# Patient Record
Sex: Female | Born: 1960
Health system: Southern US, Community
[De-identification: ages and names within clinical notes are randomized; demographics above are authoritative.]

## PROBLEM LIST (undated history)

## (undated) DIAGNOSIS — Q211 Atrial septal defect, unspecified: Secondary | ICD-10-CM

## (undated) DIAGNOSIS — I071 Rheumatic tricuspid insufficiency: Secondary | ICD-10-CM

## (undated) DIAGNOSIS — R001 Bradycardia, unspecified: Secondary | ICD-10-CM

## (undated) DIAGNOSIS — E669 Obesity, unspecified: Secondary | ICD-10-CM

## (undated) DIAGNOSIS — I340 Nonrheumatic mitral (valve) insufficiency: Secondary | ICD-10-CM

## (undated) DIAGNOSIS — G43909 Migraine, unspecified, not intractable, without status migrainosus: Secondary | ICD-10-CM

## (undated) DIAGNOSIS — E785 Hyperlipidemia, unspecified: Secondary | ICD-10-CM

## (undated) DIAGNOSIS — E039 Hypothyroidism, unspecified: Secondary | ICD-10-CM

## (undated) DIAGNOSIS — R011 Cardiac murmur, unspecified: Secondary | ICD-10-CM

## (undated) HISTORY — DX: Hypothyroidism, unspecified: E03.9

## (undated) HISTORY — DX: Hyperlipidemia, unspecified: E78.5

## (undated) HISTORY — DX: Rheumatic tricuspid insufficiency: I07.1

## (undated) HISTORY — DX: Nonrheumatic mitral (valve) insufficiency: I34.0

## (undated) HISTORY — DX: Obesity, unspecified: E66.9

## (undated) HISTORY — DX: Atrial septal defect: Q21.1

## (undated) HISTORY — DX: Bradycardia, unspecified: R00.1

## (undated) HISTORY — DX: Atrial septal defect, unspecified: Q21.10

---

## 1998-07-21 ENCOUNTER — Ambulatory Visit (HOSPITAL_COMMUNITY): Admission: RE | Admit: 1998-07-21 | Discharge: 1998-07-21 | Payer: Self-pay | Admitting: *Deleted

## 1998-07-21 ENCOUNTER — Encounter: Admission: RE | Admit: 1998-07-21 | Discharge: 1998-07-21 | Payer: Self-pay | Admitting: Sports Medicine

## 1999-02-06 ENCOUNTER — Encounter: Admission: RE | Admit: 1999-02-06 | Discharge: 1999-02-06 | Payer: Self-pay | Admitting: Sports Medicine

## 1999-02-20 ENCOUNTER — Encounter: Payer: Self-pay | Admitting: Sports Medicine

## 1999-02-20 ENCOUNTER — Encounter: Admission: RE | Admit: 1999-02-20 | Discharge: 1999-02-20 | Payer: Self-pay | Admitting: Sports Medicine

## 2000-01-19 ENCOUNTER — Other Ambulatory Visit: Admission: RE | Admit: 2000-01-19 | Discharge: 2000-01-19 | Payer: Self-pay | Admitting: Family Medicine

## 2001-01-23 ENCOUNTER — Other Ambulatory Visit: Admission: RE | Admit: 2001-01-23 | Discharge: 2001-01-23 | Payer: Self-pay | Admitting: Family Medicine

## 2001-01-25 ENCOUNTER — Encounter: Admission: RE | Admit: 2001-01-25 | Discharge: 2001-01-25 | Payer: Self-pay | Admitting: Family Medicine

## 2001-01-25 ENCOUNTER — Encounter: Payer: Self-pay | Admitting: Family Medicine

## 2001-07-06 ENCOUNTER — Encounter: Admission: RE | Admit: 2001-07-06 | Discharge: 2001-07-06 | Payer: Self-pay | Admitting: Family Medicine

## 2002-08-22 ENCOUNTER — Ambulatory Visit (HOSPITAL_COMMUNITY): Admission: RE | Admit: 2002-08-22 | Discharge: 2002-08-22 | Payer: Self-pay | Admitting: Family Medicine

## 2002-08-22 ENCOUNTER — Encounter: Payer: Self-pay | Admitting: Family Medicine

## 2003-05-02 ENCOUNTER — Ambulatory Visit (HOSPITAL_COMMUNITY): Admission: RE | Admit: 2003-05-02 | Discharge: 2003-05-02 | Payer: Self-pay | Admitting: Family Medicine

## 2004-05-15 ENCOUNTER — Ambulatory Visit: Payer: Self-pay | Admitting: Sports Medicine

## 2004-12-23 ENCOUNTER — Other Ambulatory Visit: Admission: RE | Admit: 2004-12-23 | Discharge: 2004-12-23 | Payer: Self-pay | Admitting: Family Medicine

## 2004-12-30 ENCOUNTER — Encounter: Admission: RE | Admit: 2004-12-30 | Discharge: 2004-12-30 | Payer: Self-pay | Admitting: Sports Medicine

## 2005-01-11 ENCOUNTER — Encounter: Admission: RE | Admit: 2005-01-11 | Discharge: 2005-01-11 | Payer: Self-pay | Admitting: Sports Medicine

## 2005-12-27 ENCOUNTER — Other Ambulatory Visit: Admission: RE | Admit: 2005-12-27 | Discharge: 2005-12-27 | Payer: Self-pay | Admitting: Family Medicine

## 2006-01-27 ENCOUNTER — Ambulatory Visit (HOSPITAL_COMMUNITY): Admission: RE | Admit: 2006-01-27 | Discharge: 2006-01-27 | Payer: Self-pay | Admitting: Family Medicine

## 2006-01-31 DIAGNOSIS — I071 Rheumatic tricuspid insufficiency: Secondary | ICD-10-CM

## 2006-01-31 DIAGNOSIS — I340 Nonrheumatic mitral (valve) insufficiency: Secondary | ICD-10-CM

## 2006-01-31 HISTORY — DX: Rheumatic tricuspid insufficiency: I07.1

## 2006-01-31 HISTORY — PX: DOPPLER ECHOCARDIOGRAPHY: SHX263

## 2006-01-31 HISTORY — DX: Nonrheumatic mitral (valve) insufficiency: I34.0

## 2006-02-02 ENCOUNTER — Encounter: Payer: Self-pay | Admitting: Cardiology

## 2006-05-22 IMAGING — CT CT PARANASAL SINUSES LIMITED
1 series · 16 of 24 positions shown, 20 images · non-contrast
Comparison: none

CLINICAL DATA: Facial pressure and drainage.  
LIMITED CT OF PARANASAL SINUSES:
TECHNIQUE: Limited coronal CT images were obtained through the paranasal sinuses without intravenous contrast.

[Series 2: limited sinus · axial · 0.33mm/px · z∈[+15,+97]mm · 16 of 24 slices shown, 20 images]
[im 2/24  brain]
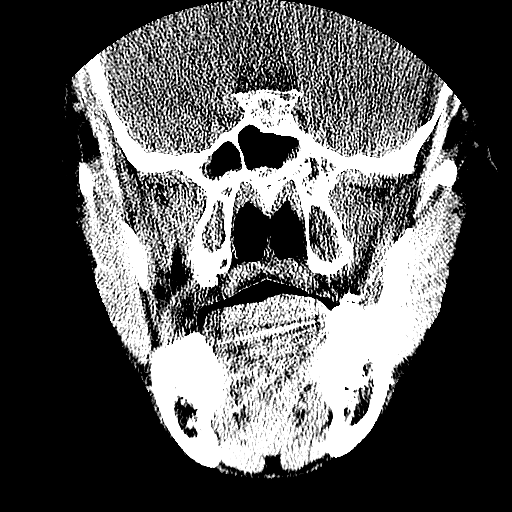
[im 2/24  bone]
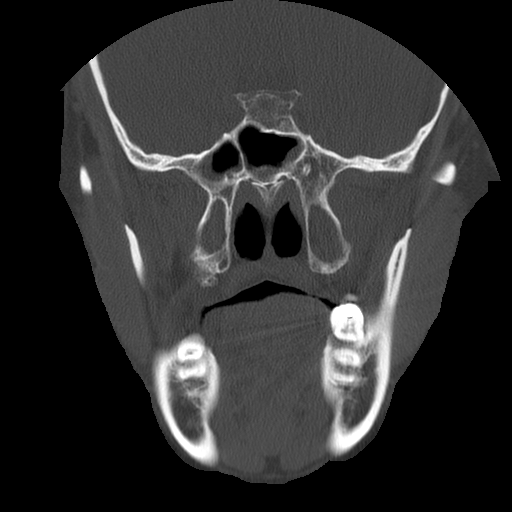
[im 4/24  bone]
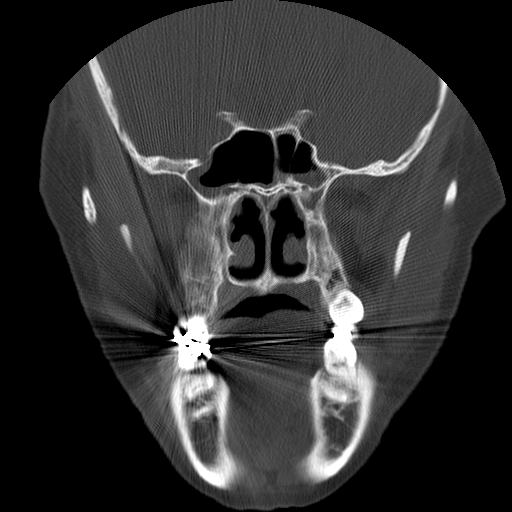
[im 5/24  bone]
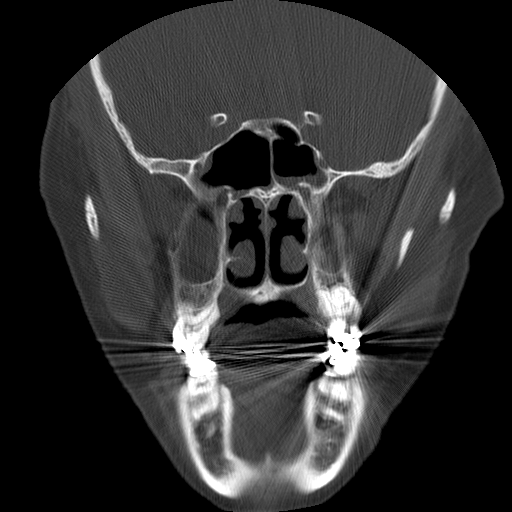
[im 6/24  bone]
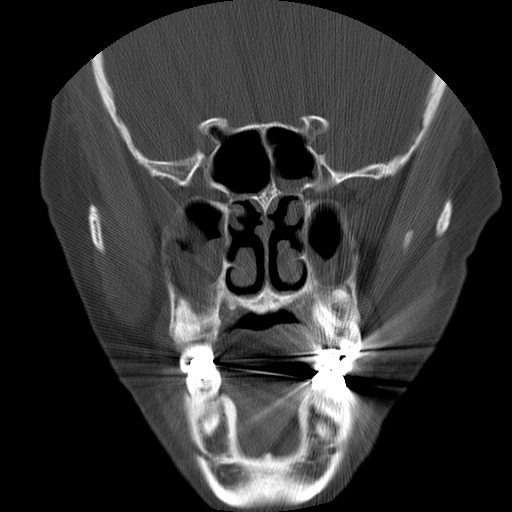
[im 8/24  brain]
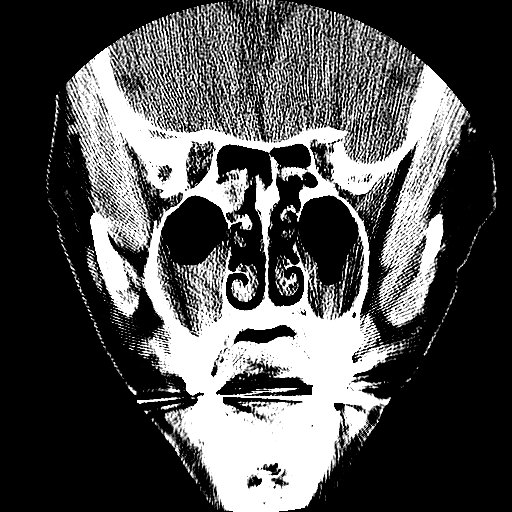
[im 8/24  bone]
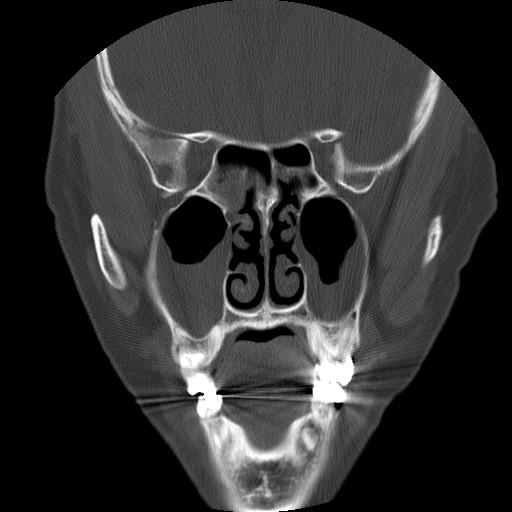
[im 9/24  bone]
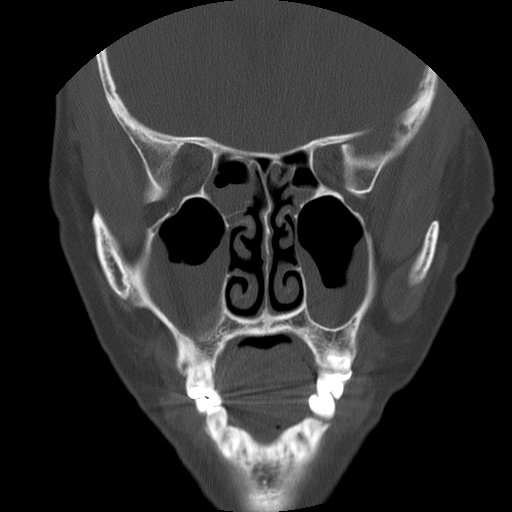
[im 10/24  bone]
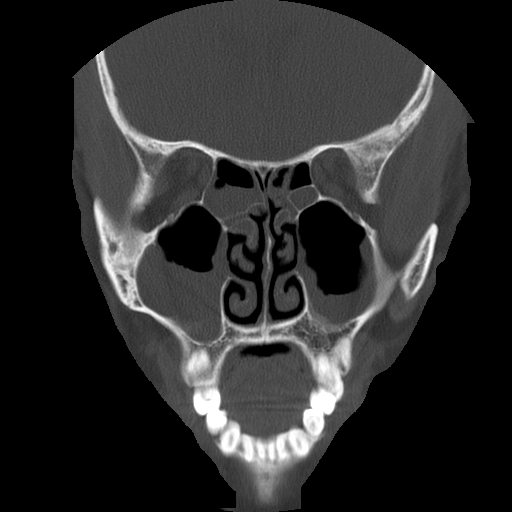
[im 12/24  bone]
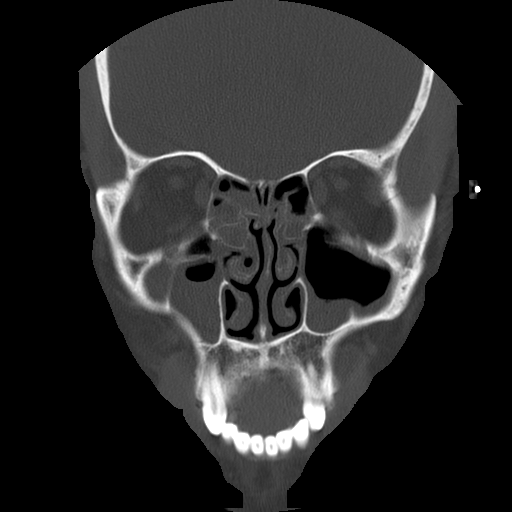
[im 13/24  brain]
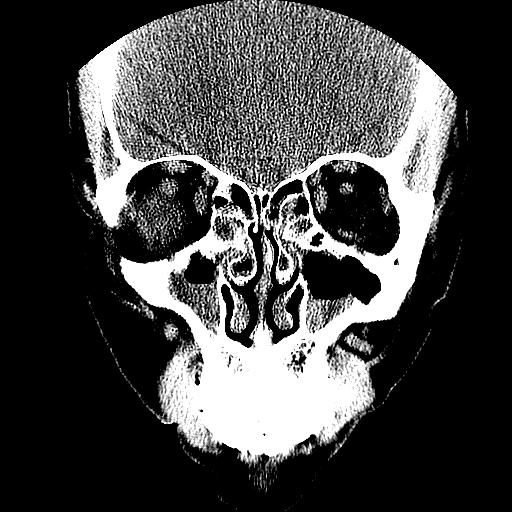
[im 13/24  bone]
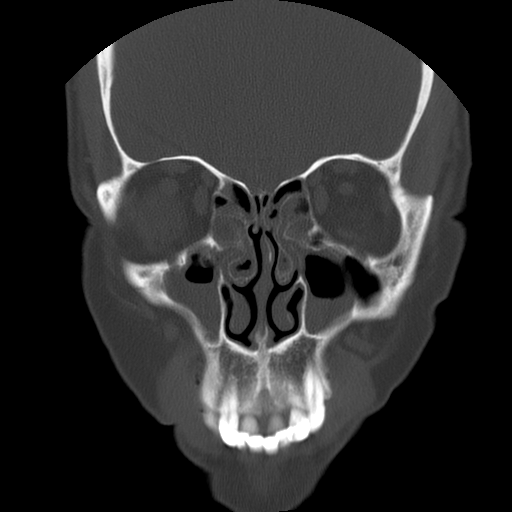
[im 15/24  bone]
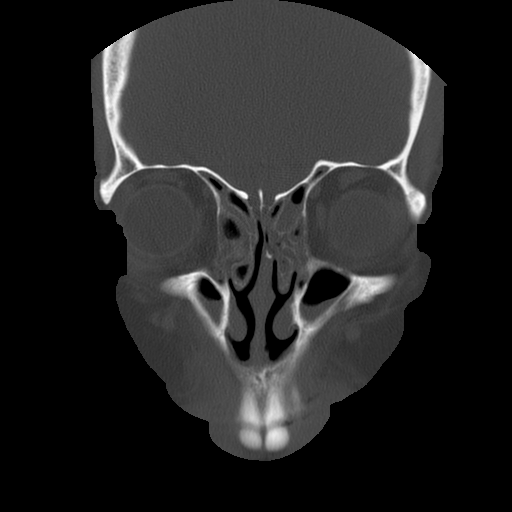
[im 16/24  bone]
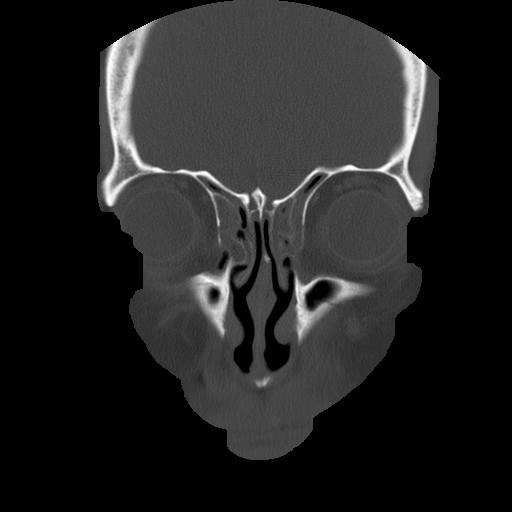
[im 17/24  bone]
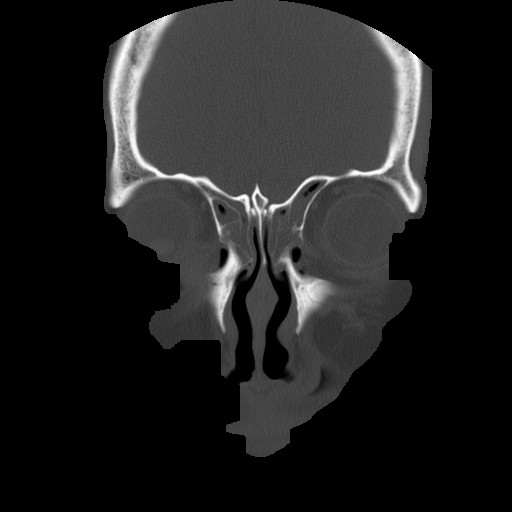
[im 19/24  brain]
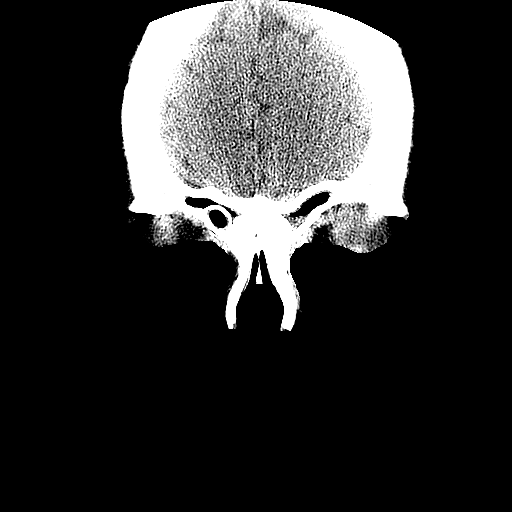
[im 19/24  bone]
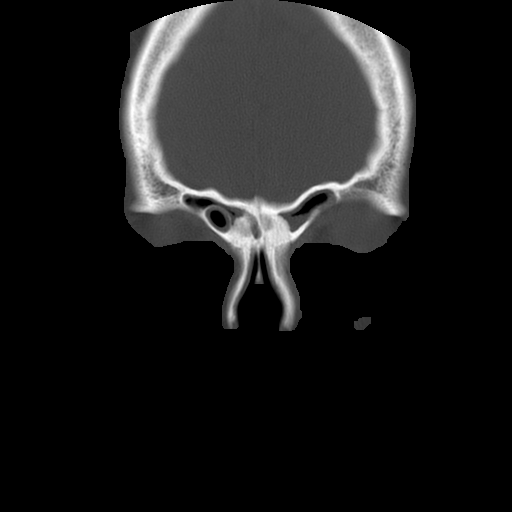
[im 20/24  bone]
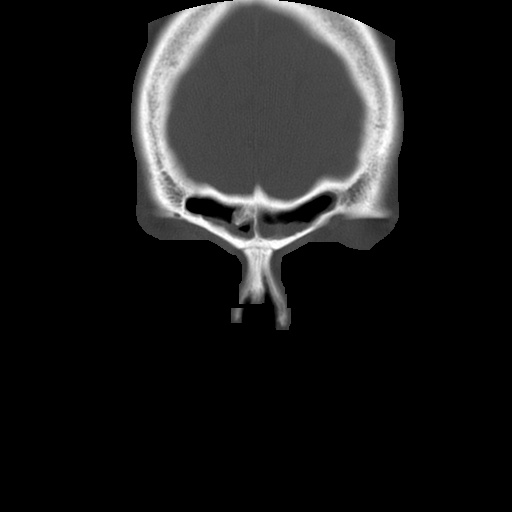
[im 21/24  bone]
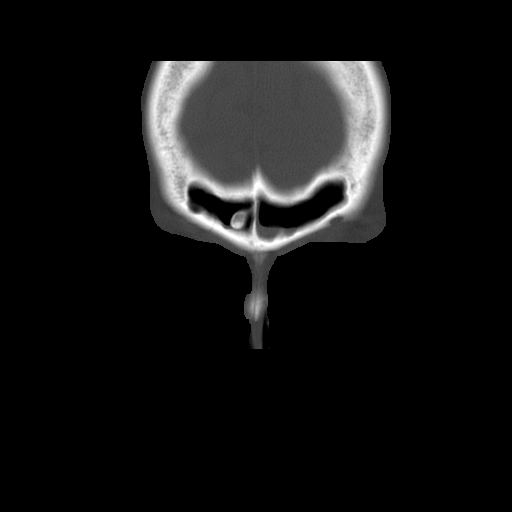
[im 23/24  bone]
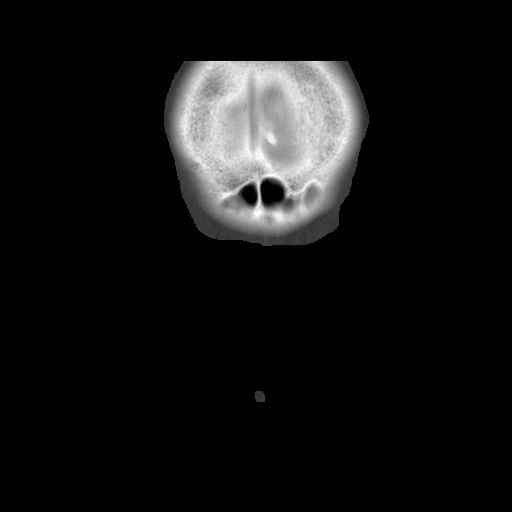

[16 of 24 positions shown; findings below may reference images not displayed]

FINDINGS: Mucosal thickening involves the frontal, ethmoid, sphenoid, and maxillary sinuses.  Some fluid may be present as well involving all of the sinuses.  Opacification of multiple ethmoid sinus air cells.  Mucosal thickening encroaches on the ostiomeatal units including the infundibula and middle meatus.  No evidence of bony destruction.  Some of the ethmoid sinus bony septae may be attenuated due to the effects of chronic disease.  Nasal turbinates are unremarkable.  Pneumatization of the left nasal turbinate.
IMPRESSION: Pansinusitis.  See comments above.

## 2008-05-06 HISTORY — PX: ELECTROCARDIOGRAM: SHX264

## 2009-03-05 ENCOUNTER — Other Ambulatory Visit: Admission: RE | Admit: 2009-03-05 | Discharge: 2009-03-05 | Payer: Self-pay | Admitting: Family Medicine

## 2009-03-21 ENCOUNTER — Encounter: Payer: Self-pay | Admitting: Family Medicine

## 2009-09-23 ENCOUNTER — Ambulatory Visit: Payer: Self-pay | Admitting: Family Medicine

## 2009-09-23 DIAGNOSIS — M719 Bursopathy, unspecified: Secondary | ICD-10-CM

## 2009-09-23 DIAGNOSIS — M67919 Unspecified disorder of synovium and tendon, unspecified shoulder: Secondary | ICD-10-CM | POA: Insufficient documentation

## 2010-05-05 NOTE — Assessment & Plan Note (Signed)
Summary: R SHOULDER PAIN,MC   Vital Signs:  Patient profile:   50 year old female Height:      68 inches Weight:      208 pounds BMI:     31.74 BP sitting:   120 / 70  Vitals Entered By: Lillia Pauls CMA (September 23, 2009 1:44 PM)  History of Present Illness: Several months of worsening right shoulder pain.  Pain with overhead motion, difficulty taking shirt off when she has to pullover her head. No specific injury.  No weakness, no numbness or tingling in her hand or arm. Does have pain that radiates all of the way down to her hand at times.  PERTINENT PMH/PSH: No previous right shoulder injury or surgery. Not diabetic PERTINENTSH nonsmoker,    Allergies (verified): No Known Drug Allergies  Review of Systems  The patient denies fever.         see hpi  Physical Exam  General:  alert and well-hydrated.   Msk:  FROm right shoulder with intact strength in all rotator cuff muscles. pain with suprapinati=us testing and some with resisted external rotaton. Bicep tendon is nontender to palpation. Bicep and eltoid intact strength, no deformity. Distally neurovascularly intact. crossover test extremely positive Additional Exam:  [place] rotator cuff muscles intact wit no sign of tear. Some edema in subacromial bursa. No impingement seen on dynamic testing. Subscapularis muscle appears unusually thin but no tear is seen. Patient given informed consent for injection. Discussed possible complications of infection, bleeding or skin atrophy at site of injection. Possible side effect of avascular necrosis (focal area of bone death) due to steroid use.Appropriate verbal time out taken Are cleaned and prepped in usual sterile fashion. A ---1- cc kennalog plus ---4-cc 1% lidocaine without epinephrine was injected into the-right subacromial bursa--. Patient tolerated procedure well with no complications.    Impression & Recommendations:  Problem # 1:  ROTATOR CUFF SYNDROME, RIGHT  (ICD-726.10)  Orders: Joint Aspirate / Injection, Large (14782) Kenalog 10 mg inj (J3301) Theraband per yard (A9300)  Complete Medication List: 1)  Flexeril 10 Mg Tabs (Cyclobenzaprine hcl) .... 1/2 -1 tab by mouth two times a day or three times a day as needed back pain

## 2010-08-19 ENCOUNTER — Other Ambulatory Visit (HOSPITAL_BASED_OUTPATIENT_CLINIC_OR_DEPARTMENT_OTHER): Payer: Self-pay | Admitting: Family Medicine

## 2010-08-19 ENCOUNTER — Ambulatory Visit (HOSPITAL_BASED_OUTPATIENT_CLINIC_OR_DEPARTMENT_OTHER)
Admission: RE | Admit: 2010-08-19 | Discharge: 2010-08-19 | Disposition: A | Discharge status: Home / Self Care | Payer: 59 | Source: Ambulatory Visit | Attending: Family Medicine | Admitting: Family Medicine

## 2010-08-19 DIAGNOSIS — Z1231 Encounter for screening mammogram for malignant neoplasm of breast: Secondary | ICD-10-CM | POA: Insufficient documentation

## 2010-08-28 ENCOUNTER — Encounter: Payer: Self-pay | Admitting: Internal Medicine

## 2010-09-01 ENCOUNTER — Other Ambulatory Visit: Payer: Self-pay | Admitting: *Deleted

## 2010-09-01 MED ORDER — KETOPROFEN POWD: Status: DC

## 2010-09-04 ENCOUNTER — Encounter: Payer: Self-pay | Admitting: Internal Medicine

## 2010-09-07 ENCOUNTER — Encounter: Payer: Self-pay | Admitting: Internal Medicine

## 2010-09-07 ENCOUNTER — Ambulatory Visit (INDEPENDENT_AMBULATORY_CARE_PROVIDER_SITE_OTHER): Payer: 59 | Admitting: Internal Medicine

## 2010-09-07 DIAGNOSIS — E785 Hyperlipidemia, unspecified: Secondary | ICD-10-CM

## 2010-09-07 DIAGNOSIS — Q211 Atrial septal defect, unspecified: Secondary | ICD-10-CM

## 2010-09-07 DIAGNOSIS — E669 Obesity, unspecified: Secondary | ICD-10-CM

## 2010-09-07 DIAGNOSIS — Q2111 Secundum atrial septal defect: Secondary | ICD-10-CM

## 2010-09-07 NOTE — Patient Instructions (Signed)
Your physician wants you to follow-up in: 18 months with Dr. Ross.  You will receive a reminder letter in the mail two months in advance. If you don't receive a letter, please call our office to schedule the follow-up appointment.  

## 2010-09-10 DIAGNOSIS — Q211 Atrial septal defect: Secondary | ICD-10-CM | POA: Insufficient documentation

## 2010-09-10 DIAGNOSIS — E669 Obesity, unspecified: Secondary | ICD-10-CM | POA: Insufficient documentation

## 2010-09-10 DIAGNOSIS — E785 Hyperlipidemia, unspecified: Secondary | ICD-10-CM | POA: Insufficient documentation

## 2010-09-10 NOTE — Assessment & Plan Note (Signed)
Encouraged her to stick with Weight [employer]chers.

## 2010-09-10 NOTE — Assessment & Plan Note (Signed)
Small report.  Exam reflects this. I would continue ASA.  I will review the exercise induced headaches.  I don't think intervention would affect this. F/U in 18 months.

## 2010-09-10 NOTE — Progress Notes (Signed)
HPI  Patient is a 50 year old who was previously followed by S. Tennant.  She has a history of a small ASD (with normal RV size on echo).  She has undergone eval by Jeanella Cara for closure in the past (given exercise induced migraines). She was last seen in July 2010. She also has a history of obesity and dyslipidemia. Since seen she denies chest pains. NO SOB.  No palpitations. She does have problems with headaches after exercising.  No SOB.   She is back on Weight Watchers.   No Known Allergies  Current Outpatient Prescriptions  Medication Sig Dispense Refill  . aspirin 81 MG tablet Take 81 mg by mouth daily.        . Atorvastatin Calcium (LIPITOR PO) Take 20 mg by mouth daily.       . Multiple Vitamin (MULTIVITAMIN PO) Take by mouth.          Past Medical History  Diagnosis Date  . Obesity   . ASD (atrial septal defect)     Small  . Hyperlipidemia   . Sinus bradycardia   . Hx of migraines     Would not occur without exertion  . Trace mitral regurgitation by prior echocardiogram 01/31/2006  . Trace tricuspid regurgitation by prior echocardiogram 01/31/2006    Past Surgical History  Procedure Date  . Electrocardiogram 05/2008  . Doppler echocardiography 01/31/2006    Family History  Problem Relation Age of Onset  . COPD Father   . Mitral valve prolapse Mother     Prothesis    History   Social History  . Marital Status: Married    Spouse Name: N/A    Number of Children: N/A  . Years of Education: N/A   Occupational History  . [employer]   Social History Main Topics  . Smoking status: Never Smoker   . Smokeless tobacco: Not on file  . Alcohol Use: Yes     1-2 per month  . Drug Use: Yes     quit 1981  . Sexually Active: Not on file   Other Topics Concern  . Not on file   Social History Narrative   Married and lives at home with her husbandNo childrenReasonably active and will walk to work    Review of Systems:  All systems  reviewed.  They are negative to the above problem except as previously stated.  Vital Signs: BP 118/80  Pulse 54  Resp 18  Ht 5\' 8"  (1.727 m)  Wt 192 lb (87.091 kg)  BMI 29.19 kg/m2  Physical Exam  Patient is in NAD  HEENT:  Normocephalic, atraumatic. EOMI, PERRLA.  Neck: JVP is normal. No thyromegaly. No bruits.  Lungs: clear to auscultation. No rales no wheezes.  Heart: Regular rate and rhythm. Normal S1, S2. No S3.   No significant murmurs. PMI not displaced.  Abdomen:  Supple, nontender. Normal bowel sounds. No masses. No hepatomegaly.  Extremities:   Good distal pulses throughout. No lower extremity edema.  Musculoskeletal :moving all extremities.  Neuro:   alert and oriented x3.  CN II-XII grossly intact.  EKG::  Sinus bradycardia 54 bpm.  Assessment and Plan:

## 2010-09-10 NOTE — Assessment & Plan Note (Signed)
WIll have labs faxed.

## 2010-10-14 ENCOUNTER — Other Ambulatory Visit: Payer: Self-pay | Admitting: Family Medicine

## 2010-10-14 ENCOUNTER — Other Ambulatory Visit (HOSPITAL_COMMUNITY)
Admission: RE | Admit: 2010-10-14 | Discharge: 2010-10-14 | Disposition: A | Discharge status: Home / Self Care | Payer: 59 | Source: Ambulatory Visit | Attending: Family Medicine | Admitting: Family Medicine

## 2010-10-14 DIAGNOSIS — Z01419 Encounter for gynecological examination (general) (routine) without abnormal findings: Secondary | ICD-10-CM | POA: Insufficient documentation

## 2011-12-10 ENCOUNTER — Other Ambulatory Visit (HOSPITAL_BASED_OUTPATIENT_CLINIC_OR_DEPARTMENT_OTHER): Payer: Self-pay | Admitting: Family Medicine

## 2011-12-10 DIAGNOSIS — Z1231 Encounter for screening mammogram for malignant neoplasm of breast: Secondary | ICD-10-CM

## 2011-12-15 ENCOUNTER — Ambulatory Visit (HOSPITAL_BASED_OUTPATIENT_CLINIC_OR_DEPARTMENT_OTHER)
Admission: RE | Admit: 2011-12-15 | Discharge: 2011-12-15 | Disposition: A | Discharge status: Home / Self Care | Payer: 59 | Source: Ambulatory Visit | Attending: Family Medicine | Admitting: Family Medicine

## 2011-12-15 DIAGNOSIS — Z1231 Encounter for screening mammogram for malignant neoplasm of breast: Secondary | ICD-10-CM | POA: Insufficient documentation

## 2012-04-21 ENCOUNTER — Other Ambulatory Visit: Payer: Self-pay | Admitting: Family Medicine

## 2012-04-21 MED ORDER — HYDROCORTISONE ACETATE 25 MG RE SUPP: RECTAL | Status: DC

## 2012-05-20 ENCOUNTER — Other Ambulatory Visit: Payer: Self-pay

## 2012-07-31 ENCOUNTER — Encounter: Payer: Self-pay | Admitting: Gastroenterology

## 2012-08-22 ENCOUNTER — Encounter: Payer: Self-pay | Admitting: Gastroenterology

## 2012-08-22 ENCOUNTER — Ambulatory Visit (INDEPENDENT_AMBULATORY_CARE_PROVIDER_SITE_OTHER): Payer: 59 | Admitting: Gastroenterology

## 2012-08-22 VITALS — BP 108/66 | HR 52 | Ht 68.0 in | Wt 188.0 lb

## 2012-08-22 DIAGNOSIS — K602 Anal fissure, unspecified: Secondary | ICD-10-CM | POA: Insufficient documentation

## 2012-08-22 DIAGNOSIS — Z1211 Encounter for screening for malignant neoplasm of colon: Secondary | ICD-10-CM

## 2012-08-22 MED ORDER — NITROGLYCERIN 0.4 % RE OINT: 1.0000 "application " | TOPICAL_OINTMENT | Freq: Two times a day (BID) | RECTAL | Status: DC

## 2012-08-22 NOTE — Patient Instructions (Addendum)
Follow up in 4-6 weeks Elective colonoscopy has been recommended

## 2012-08-22 NOTE — Assessment & Plan Note (Addendum)
Patient has a symptomatic fissure  Recommendations #1 warm soaks #2 topical therapy with Rectiva

## 2012-08-22 NOTE — Assessment & Plan Note (Signed)
I have recommended screening colonoscopy

## 2012-08-22 NOTE — Progress Notes (Signed)
History of Present Illness: Pleasant 52 year old white female referred at the request of Dr. Melvenia Beam for evaluation of rectal discomfort. For months she's had rectal itching and pain. She has seen minimal bleeding. She treated herself with suppositories without improvement. She moves her bowels regularly.    Past Medical History  Diagnosis Date  . Obesity   . ASD (atrial septal defect)     Small  . Hyperlipidemia   . Sinus bradycardia   . Hx of migraines     Would not occur without exertion  . Trace mitral regurgitation by prior echocardiogram 01/31/2006  . Trace tricuspid regurgitation by prior echocardiogram 01/31/2006  . Hypothyroidism    Past Surgical History  Procedure Laterality Date  . Electrocardiogram  05/2008  . Doppler echocardiography  01/31/2006   family history includes COPD in her father and Mitral valve prolapse in her mother. Current Outpatient Prescriptions  Medication Sig Dispense Refill  . aspirin 81 MG tablet Take 81 mg by mouth daily.        . Atorvastatin Calcium (LIPITOR PO) Take 20 mg by mouth daily.       . hydrocortisone (ANUSOL-HC) 25 MG suppository Insert one in rectum up tp three times a day as needed for hemorrhoid pain  30 suppository  2  . levothyroxine (SYNTHROID, LEVOTHROID) 100 MCG tablet Take 100 mcg by mouth daily before breakfast.      . Multiple Vitamin (MULTIVITAMIN PO) Take by mouth.         No current facility-administered medications for this visit.   Allergies as of 08/22/2012  . (No Known Allergies)    reports that she has never smoked. She has never used smokeless tobacco. She reports that  drinks alcohol. She reports that she uses illicit drugs (Marijuana).     Review of Systems: Pertinent positive and negative review of systems were noted in the above HPI section. All other review of systems were otherwise negative.  Vital signs were reviewed in today's medical record Physical Exam: General: Well developed , well nourished,  no acute distress Skin: anicteric Head: Normocephalic and atraumatic Eyes:  sclerae anicteric, EOMI Ears: Normal auditory acuity Mouth: No deformity or lesions Neck: Supple, no masses or thyromegaly Lungs: Clear throughout to auscultation Heart: Regular rate and rhythm; no murmurs, rubs or bruits Abdomen: Soft, non tender and non distended. No masses, hepatosplenomegaly or hernias noted. Normal Bowel sounds Rectal: There is a sentinel skin tag and anal fissure at the 6:00 position Musculoskeletal: Symmetrical with no gross deformities  Skin: No lesions on visible extremities Pulses:  Normal pulses noted Extremities: No clubbing, cyanosis, edema or deformities noted Neurological: Alert oriented x 4, grossly nonfocal Cervical Nodes:  No significant cervical adenopathy Inguinal Nodes: No significant inguinal adenopathy Psychological:  Alert and cooperative. Normal mood and affect

## 2012-09-29 ENCOUNTER — Encounter: Payer: Self-pay | Admitting: *Deleted

## 2012-09-29 ENCOUNTER — Ambulatory Visit
Admission: RE | Admit: 2012-09-29 | Discharge: 2012-09-29 | Disposition: A | Discharge status: Home / Self Care | Payer: 59 | Source: Ambulatory Visit | Attending: Sports Medicine | Admitting: Sports Medicine

## 2012-09-29 DIAGNOSIS — M542 Cervicalgia: Secondary | ICD-10-CM

## 2012-10-02 ENCOUNTER — Ambulatory Visit
Admission: RE | Admit: 2012-10-02 | Discharge: 2012-10-02 | Disposition: A | Discharge status: Home / Self Care | Payer: 59 | Source: Ambulatory Visit | Attending: Family Medicine | Admitting: Family Medicine

## 2012-10-02 ENCOUNTER — Other Ambulatory Visit: Payer: Self-pay | Admitting: Family Medicine

## 2012-10-02 ENCOUNTER — Other Ambulatory Visit: Payer: Self-pay | Admitting: *Deleted

## 2012-10-02 DIAGNOSIS — M542 Cervicalgia: Secondary | ICD-10-CM

## 2012-10-03 ENCOUNTER — Encounter: Payer: Self-pay | Admitting: Family Medicine

## 2012-10-05 ENCOUNTER — Ambulatory Visit (INDEPENDENT_AMBULATORY_CARE_PROVIDER_SITE_OTHER): Payer: 59 | Admitting: Cardiovascular Disease

## 2012-10-05 ENCOUNTER — Encounter: Payer: Self-pay | Admitting: Cardiovascular Disease

## 2012-10-05 VITALS — BP 114/90 | HR 51 | Ht 68.0 in | Wt 188.0 lb

## 2012-10-05 DIAGNOSIS — Q211 Atrial septal defect: Secondary | ICD-10-CM

## 2012-10-05 DIAGNOSIS — Q2111 Secundum atrial septal defect: Secondary | ICD-10-CM

## 2012-10-05 NOTE — Patient Instructions (Addendum)
Your physician has requested that you have an echocardiogram. Echocardiography is a painless test that uses sound waves to create images of your heart. It provides your doctor with information about the size and shape of your heart and how well your heart's chambers and valves are working. This procedure takes approximately one hour. There are no restrictions for this procedure.  Your physician wants you to follow-up in: 1 YEAR with Dr Cooper. You will receive a reminder letter in the mail two months in advance. If you don't receive a letter, please call our office to schedule the follow-up appointment.  Your physician recommends that you continue on your current medications as directed. Please refer to the Current Medication list given to you today.  

## 2012-10-05 NOTE — Progress Notes (Signed)
HPI:  52 year old woman presenting for followup evaluation. She has been followed by Dr. Deborah Chalk in the past. She was seen by Dr. Tenny Craw in 2012. The patient has a small atrial septal defect with normal right ventricular size on echo. She was evaluated for transcatheter closure by Dr. Jacinto Halim in the past. I have no record of an echo on file since 2007. She had a history of migraine headaches associated with exercise. Conservative management was recommended at the time.  The patient is doing quite well. She exercises regularly. She works with a Psychologist, educational and also does a brisk walk/jog a few days a week of 3-4 miles. She denies exertional shortness of breath, chest pain, chest tightness, or palpitations. She denies leg swelling, orthopnea, or PND. She is compliant with diet and her medications. She takes aspirin for antiplatelet therapy and Lipitor for lipid-lowering.  Outpatient Encounter Prescriptions as of 10/05/2012  Medication Sig Dispense Refill  . aspirin 81 MG tablet Take 81 mg by mouth daily.        . Atorvastatin Calcium (LIPITOR PO) Take 20 mg by mouth daily.       . hydrocortisone (ANUSOL-HC) 25 MG suppository Insert one in rectum up tp three times a day as needed for hemorrhoid pain  30 suppository  2  . levothyroxine (SYNTHROID, LEVOTHROID) 100 MCG tablet Take 100 mcg by mouth daily before breakfast.      . Multiple Vitamin (MULTIVITAMIN PO) Take by mouth.        . [DISCONTINUED] Nitroglycerin (RECTIV) 0.4 % OINT Place 1 application rectally 2 (two) times daily.  1 Tube  2   No facility-administered encounter medications on file as of 10/05/2012.    No Known Allergies  Past Medical History  Diagnosis Date  . Obesity   . ASD (atrial septal defect)     Small  . Hyperlipidemia   . Sinus bradycardia   . Hx of migraines     Would not occur without exertion  . Trace mitral regurgitation by prior echocardiogram 01/31/2006  . Trace tricuspid regurgitation by prior echocardiogram 01/31/2006    . Hypothyroidism    ROS: Negative except as per HPI  BP 114/90  Pulse 51  Ht 5\' 8"  (1.727 m)  Wt 188 lb (85.276 kg)  BMI 28.59 kg/m2  PHYSICAL EXAM: Pt is alert and oriented, very pleasant woman in NAD HEENT: normal Neck: JVP - normal, carotids 2+= without bruits Lungs: CTA bilaterally CV: RRR with fixed split S2, I do not appreciate a murmur. Abd: soft, NT, Positive BS, no hepatomegaly Ext: no C/C/E, distal pulses intact and equal Skin: warm/dry no rash  EKG:  Sinus bradycardia 51 beats per minute, incomplete right bundle branch block.  ASSESSMENT AND PLAN: 1. Atrial septal defect. Patient describes a long history of this. She tells me she was restricted from activities when she was younger. She seems completely asymptomatic at this point. Her EKG and exam are suggestive of this diagnosis, but I do not suspect a large ASD as there is no murmur suggestive of a significant left to right shunt. I'm going to check an echocardiogram to rule out RV enlargement. We discussed potential problems related to an atrial septal defect, such as development of atrial fibrillation or increasing shunt with age as diastolic dysfunction occurs. However I'm hopeful that her defect is small and she may not experience problems related to it. Otherwise I will plan on seeing her back in one year for followup evaluation.  2. Hyperlipidemia. The  patient is followed by her primary physician. She's on a statin drug and takes low-dose aspirin.  For followup I will see her back in one year.  Tonny Bollman 10/05/2012 12:21 PM

## 2012-10-16 ENCOUNTER — Other Ambulatory Visit (HOSPITAL_COMMUNITY): Payer: 59

## 2012-10-20 ENCOUNTER — Ambulatory Visit (HOSPITAL_COMMUNITY): Payer: 59 | Attending: Cardiology | Admitting: Radiology

## 2012-10-20 DIAGNOSIS — Q2111 Secundum atrial septal defect: Secondary | ICD-10-CM | POA: Insufficient documentation

## 2012-10-20 DIAGNOSIS — E039 Hypothyroidism, unspecified: Secondary | ICD-10-CM | POA: Insufficient documentation

## 2012-10-20 DIAGNOSIS — Q211 Atrial septal defect: Secondary | ICD-10-CM

## 2012-10-20 DIAGNOSIS — I498 Other specified cardiac arrhythmias: Secondary | ICD-10-CM | POA: Insufficient documentation

## 2012-10-20 NOTE — Progress Notes (Signed)
Echocardiogram performed.  

## 2013-02-08 ENCOUNTER — Other Ambulatory Visit: Payer: Self-pay

## 2013-09-28 ENCOUNTER — Other Ambulatory Visit (HOSPITAL_COMMUNITY)
Admission: RE | Admit: 2013-09-28 | Discharge: 2013-09-28 | Disposition: A | Discharge status: Home / Self Care | Payer: 59 | Source: Ambulatory Visit | Attending: Family Medicine | Admitting: Family Medicine

## 2013-09-28 ENCOUNTER — Other Ambulatory Visit: Payer: Self-pay | Admitting: Family Medicine

## 2013-09-28 DIAGNOSIS — Z1151 Encounter for screening for human papillomavirus (HPV): Secondary | ICD-10-CM | POA: Insufficient documentation

## 2013-09-28 DIAGNOSIS — Z0000 Encounter for general adult medical examination without abnormal findings: Secondary | ICD-10-CM | POA: Insufficient documentation

## 2013-10-01 LAB — CYTOLOGY - PAP

## 2013-11-08 ENCOUNTER — Ambulatory Visit (INDEPENDENT_AMBULATORY_CARE_PROVIDER_SITE_OTHER): Payer: 59 | Admitting: Cardiovascular Disease

## 2013-11-08 ENCOUNTER — Encounter: Payer: Self-pay | Admitting: Cardiovascular Disease

## 2013-11-08 VITALS — BP 134/84 | HR 46 | Ht 68.0 in | Wt 207.0 lb

## 2013-11-08 DIAGNOSIS — Q2111 Secundum atrial septal defect: Secondary | ICD-10-CM

## 2013-11-08 DIAGNOSIS — Q211 Atrial septal defect: Secondary | ICD-10-CM

## 2013-11-08 LAB — BASIC METABOLIC PANEL
BUN: 15 mg/dL (ref 6–23)
CO2: 30 meq/L (ref 19–32)
Calcium: 9.5 mg/dL (ref 8.4–10.5)
Chloride: 105 mEq/L (ref 96–112)
Creatinine, Ser: 1 mg/dL (ref 0.4–1.2)
GFR: 61.88 mL/min (ref >60.00)
GLUCOSE: 85 mg/dL (ref 70–99)
Potassium: 4.4 mEq/L (ref 3.5–5.1)
SODIUM: 140 meq/L (ref 135–145)

## 2013-11-08 NOTE — Progress Notes (Signed)
HPI:   53 year old woman presenting for followup evaluation. The patient has a history of a small atrial septal defect. This is been diagnosed by 2-D echo. She has never undergone a TEE. She has a history of migraine headaches. Her last echocardiogram one year ago demonstrated mild right sided cardiac chamber enlargement. The patient has been completely asymptomatic from a cardiac perspective. She returns today for followup evaluation.  She denies chest pain, chest pressure, shortness of breath, edema, or heart palpitations. She's gained 20 pounds since her visit here last year.  Outpatient Encounter Prescriptions as of 11/08/2013  Medication Sig  . aspirin 81 MG tablet Take 81 mg by mouth daily.    . Atorvastatin Calcium (LIPITOR PO) Take 20 mg by mouth daily.   Marland Kitchen levothyroxine (SYNTHROID, LEVOTHROID) 100 MCG tablet Take 100 mcg by mouth daily before breakfast.  . Multiple Vitamin (MULTIVITAMIN PO) Take by mouth.    . [DISCONTINUED] hydrocortisone (ANUSOL-HC) 25 MG suppository Insert one in rectum up tp three times a day as needed for hemorrhoid pain    No Known Allergies  Past Medical History  Diagnosis Date  . Obesity   . ASD (atrial septal defect)     Small  . Hyperlipidemia   . Sinus bradycardia   . Hx of migraines     Would not occur without exertion  . Trace mitral regurgitation by prior echocardiogram 01/31/2006  . Trace tricuspid regurgitation by prior echocardiogram 01/31/2006  . Hypothyroidism    ROS: Negative except as per HPI  BP 134/84  Pulse 46  Ht 5\' 8"  (1.727 m)  Wt 207 lb (93.895 kg)  BMI 31.48 kg/m2  PHYSICAL EXAM: Pt is alert and oriented, NAD HEENT: normal Neck: JVP - normal, carotids 2+= without bruits Lungs: CTA bilaterally CV: Bradycardic and regular with a soft systolic murmur at the left upper sternal border. Abd: soft, NT, Positive BS, no hepatomegaly Ext: no C/C/E, distal pulses intact and equal Skin: warm/dry no rash  EKG:  Marked sinus  bradycardia with incomplete right bundle branch block, heart rate 46 beats per minute.  2D Echo 10/20/2012: Study Conclusions  - Left ventricle: The cavity size was normal. Wall thickness was increased in a pattern of mild LVH. The estimated ejection fraction was 60%. Wall motion was normal; there were no regional wall motion abnormalities. - Left atrium: The atrium was mildly dilated. - Right ventricle: The cavity size was mildly dilated. Systolic function was normal. - Right atrium: The atrium was mildly dilated. - Atrial septum: The history says ASD. 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 There is mild color shunting from left to right. I suspect this is a patent foramen.  ASSESSMENT AND PLAN: 1. Atrial septal defect. We again discussed the natural history of atrial septal defects. I think if her AST is small, we can likely follow her clinically. However, I have some concern that her right-sided cardiac chambers were enlarged on last year's echo. I have recommended a gated cardiac CTA to better evaluate the anatomy of her interatrial septum, pulmonary venous anatomy, and cardiac chamber size. Further plans pending the results of that study. She understands that she may require a TEE for further evaluation pending those results. If her CT scan demonstrates a very small defect, I will plan on seeing her back in one year for followup evaluation. She remains on aspirin for antiplatelet therapy.  2. Hyperlipidemia. Discussed lifestyle modification. The patient is on atorvastatin and is followed by her primary care physician.  Sherren Mocha 11/08/2013 9:29 PM

## 2013-11-08 NOTE — Patient Instructions (Addendum)
Your physician has requested that you have a gated cardiac CT with Dr Meda Coffee to read report. Cardiac computed tomography (CT) is a painless test that uses an x-ray machine to take clear, detailed pictures of your heart. For further information please visit [facility]. Please follow instruction sheet as given.  Your physician recommends that you have lab work today: BMP  Your physician wants you to follow-up in: 1 YEAR with Dr Burt Knack.  You will receive a reminder letter in the mail two months in advance. If you don't receive a letter, please call our office to schedule the follow-up appointment.  Your physician recommends that you continue on your current medications as directed. Please refer to the Current Medication list given to you today.

## 2013-11-09 ENCOUNTER — Ambulatory Visit: Payer: 59 | Admitting: Cardiovascular Disease

## 2013-11-12 ENCOUNTER — Ambulatory Visit (HOSPITAL_COMMUNITY)
Admission: RE | Admit: 2013-11-12 | Discharge: 2013-11-12 | Disposition: A | Discharge status: Home / Self Care | Payer: 59 | Source: Ambulatory Visit | Attending: Cardiovascular Disease | Admitting: Cardiovascular Disease

## 2013-11-12 DIAGNOSIS — Q211 Atrial septal defect: Secondary | ICD-10-CM

## 2013-11-12 DIAGNOSIS — Q2111 Secundum atrial septal defect: Secondary | ICD-10-CM

## 2013-11-12 MED ORDER — NITROGLYCERIN 0.4 MG SL SUBL
0.4000 mg | SUBLINGUAL_TABLET | SUBLINGUAL | Status: DC | PRN
  Administered 2013-11-12: 0.8 mg via SUBLINGUAL
  Filled 2013-11-12: qty 25

## 2013-11-12 MED ORDER — IOHEXOL 350 MG/ML SOLN
80.0000 mL | Freq: Once | INTRAVENOUS | Status: AC | PRN
  Administered 2013-11-12: 100 mL via INTRAVENOUS

## 2013-11-12 MED ORDER — NITROGLYCERIN 0.4 MG SL SUBL
SUBLINGUAL_TABLET | SUBLINGUAL | Status: AC
  Filled 2013-11-12: qty 2

## 2013-11-14 ENCOUNTER — Encounter (HOSPITAL_COMMUNITY): Payer: Self-pay | Admitting: Cardiovascular Disease

## 2013-11-20 ENCOUNTER — Encounter: Payer: Self-pay | Admitting: Cardiovascular Disease

## 2013-11-22 ENCOUNTER — Other Ambulatory Visit: Payer: Self-pay | Admitting: Cardiovascular Disease

## 2013-11-22 DIAGNOSIS — Q2116 Sinus venosus atrial septal defect, unspecified: Secondary | ICD-10-CM

## 2013-11-22 DIAGNOSIS — Q211 Atrial septal defect: Secondary | ICD-10-CM

## 2013-11-28 ENCOUNTER — Encounter (HOSPITAL_COMMUNITY)
Admission: RE | Disposition: A | Discharge status: Home / Self Care | Payer: Self-pay | Source: Ambulatory Visit | Attending: Cardiology

## 2013-11-28 ENCOUNTER — Encounter (HOSPITAL_COMMUNITY): Payer: Self-pay | Admitting: *Deleted

## 2013-11-28 ENCOUNTER — Ambulatory Visit (HOSPITAL_COMMUNITY)
Admission: RE | Admit: 2013-11-28 | Discharge: 2013-11-28 | Disposition: A | Discharge status: Home / Self Care | Payer: 59 | Source: Ambulatory Visit | Attending: Cardiology | Admitting: Cardiology

## 2013-11-28 DIAGNOSIS — E785 Hyperlipidemia, unspecified: Secondary | ICD-10-CM | POA: Insufficient documentation

## 2013-11-28 DIAGNOSIS — E669 Obesity, unspecified: Secondary | ICD-10-CM | POA: Diagnosis not present

## 2013-11-28 DIAGNOSIS — Z7982 Long term (current) use of aspirin: Secondary | ICD-10-CM | POA: Insufficient documentation

## 2013-11-28 DIAGNOSIS — Q218 Other congenital malformations of cardiac septa: Secondary | ICD-10-CM | POA: Insufficient documentation

## 2013-11-28 DIAGNOSIS — Q2111 Secundum atrial septal defect: Secondary | ICD-10-CM

## 2013-11-28 DIAGNOSIS — Q2116 Sinus venosus atrial septal defect, unspecified: Secondary | ICD-10-CM

## 2013-11-28 DIAGNOSIS — E039 Hypothyroidism, unspecified: Secondary | ICD-10-CM | POA: Diagnosis not present

## 2013-11-28 DIAGNOSIS — Q211 Atrial septal defect: Secondary | ICD-10-CM

## 2013-11-28 DIAGNOSIS — Z79899 Other long term (current) drug therapy: Secondary | ICD-10-CM | POA: Insufficient documentation

## 2013-11-28 DIAGNOSIS — Z6831 Body mass index (BMI) 31.0-31.9, adult: Secondary | ICD-10-CM | POA: Insufficient documentation

## 2013-11-28 HISTORY — PX: TEE WITHOUT CARDIOVERSION: SHX5443

## 2013-11-28 SURGERY — ECHOCARDIOGRAM, TRANSESOPHAGEAL
Anesthesia: Moderate Sedation

## 2013-11-28 MED ORDER — MIDAZOLAM HCL 5 MG/ML IJ SOLN
INTRAMUSCULAR | Status: AC
  Filled 2013-11-28: qty 2

## 2013-11-28 MED ORDER — SODIUM CHLORIDE 0.9 % IV SOLN
INTRAVENOUS | Status: DC
  Administered 2013-11-28: 500 mL via INTRAVENOUS

## 2013-11-28 MED ORDER — BUTAMBEN-TETRACAINE-BENZOCAINE 2-2-14 % EX AERO
INHALATION_SPRAY | CUTANEOUS | Status: DC | PRN
  Administered 2013-11-28: 2 via TOPICAL

## 2013-11-28 MED ORDER — FENTANYL CITRATE 0.05 MG/ML IJ SOLN
INTRAMUSCULAR | Status: DC | PRN
  Administered 2013-11-28 (×3): 25 ug via INTRAVENOUS

## 2013-11-28 MED ORDER — MIDAZOLAM HCL 10 MG/2ML IJ SOLN
INTRAMUSCULAR | Status: DC | PRN
  Administered 2013-11-28: 2 mg via INTRAVENOUS
  Administered 2013-11-28: 1 mg via INTRAVENOUS
  Administered 2013-11-28: 2 mg via INTRAVENOUS

## 2013-11-28 MED ORDER — FENTANYL CITRATE 0.05 MG/ML IJ SOLN
INTRAMUSCULAR | Status: AC
  Filled 2013-11-28: qty 2

## 2013-11-28 NOTE — Discharge Instructions (Signed)
Conscious Sedation °Sedation is the use of medicines to promote relaxation and relieve discomfort and anxiety. Conscious sedation is a type of sedation. Under conscious sedation you are less alert than normal but are still able to respond to instructions or stimulation. Conscious sedation is used during short medical and dental procedures. It is milder than deep sedation or general anesthesia and allows you to return to your regular activities sooner.  °LET YOUR HEALTH CARE PROVIDER KNOW ABOUT:  °· Any allergies you have. °· All medicines you are taking, including vitamins, herbs, eye drops, creams, and over-the-counter medicines. °· Use of steroids (by mouth or creams). °· Previous problems you or members of your family have had with the use of anesthetics. °· Any blood disorders you have. °· Previous surgeries you have had. °· Medical conditions you have. °· Possibility of pregnancy, if this applies. °· Use of cigarettes, alcohol, or illegal drugs. °RISKS AND COMPLICATIONS °Generally, this is a safe procedure. However, as with any procedure, problems can occur. Possible problems include: °· Oversedation. °· Trouble breathing on your own. You may need to have a breathing tube until you are awake and breathing on your own. °· Allergic reaction to any of the medicines used for the procedure. °BEFORE THE PROCEDURE °· You may have blood tests done. These tests can help show how well your kidneys and liver are working. They can also show how well your blood clots. °· A physical exam will be done.   °· Only take medicines as directed by your health care provider. You may need to stop taking medicines (such as blood thinners, aspirin, or nonsteroidal anti-inflammatory drugs) before the procedure.   °· Do not eat or drink at least 6 hours before the procedure or as directed by your health care provider. °· Arrange for a responsible adult, family member, or friend to take you home after the procedure. He or she should stay  with you for at least 24 hours after the procedure, until the medicine has worn off. °PROCEDURE  °· An intravenous (IV) catheter will be inserted into one of your veins. Medicine will be able to flow directly into your body through this catheter. You may be given medicine through this tube to help prevent pain and help you relax. °· The medical or dental procedure will be done. °AFTER THE PROCEDURE °· You will stay in a recovery area until the medicine has worn off. Your blood pressure and pulse will be checked.   °·  Depending on the procedure you had, you may be allowed to go home when you can tolerate liquids and your pain is under control. °Document Released: 12/15/2000 Document Revised: 03/27/2013 Document Reviewed: 11/27/2012 °ExitCare® Patient Information ©2015 [employer]. This information is not intended to replace advice given to you by your health care provider. Make sure you discuss any questions you have with your health care provider. °Transesophageal Echocardiogram °Transesophageal echocardiography (TEE) is a special type of test that produces images of the heart by using sound waves (echocardiogram). This type of echocardiography can obtain better images of the heart than standard echocardiography. TEE is done by passing a flexible tube down the esophagus. The heart is located in front of the esophagus. Because the heart and esophagus are close to one another, your health care provider can take very clear, detailed pictures of the heart via ultrasound waves. °TEE may be done: °· If your health care provider needs more information based on standard echocardiography findings. °· If you had a stroke. This might have   happened because a clot formed in your heart. TEE can visualize different areas of the heart and check for clots. °· To check valve anatomy and function. °· To check for infection on the inside of your heart (endocarditis). °· To evaluate the dividing wall (septum) of the heart and  presence of a hole that did not close after birth (patent foramen ovale or atrial septal defect). °· To help diagnose a tear in the wall of the aorta (aortic dissection). °· During cardiac valve surgery. This allows the surgeon to assess the valve repair before closing the chest. °· During a variety of other cardiac procedures to guide positioning of catheters. °· Sometimes before a cardioversion, which is a shock to convert heart rhythm back to normal. °LET YOUR HEALTH CARE PROVIDER KNOW ABOUT:  °· Any allergies you have. °· All medicines you are taking, including vitamins, herbs, eye drops, creams, and over-the-counter medicines. °· Previous problems you or members of your family have had with the use of anesthetics. °· Any blood disorders you have. °· Previous surgeries you have had. °· Medical conditions you have. °· Swallowing difficulties. °· An esophageal obstruction. °RISKS AND COMPLICATIONS  °Generally, TEE is a safe procedure. However, as with any procedure, complications can occur. Possible complications include an esophageal tear (rupture). °BEFORE THE PROCEDURE  °· Do not eat or drink for 6 hours before the procedure or as directed by your health care provider. °· Arrange for someone to drive you home after the procedure. Do not drive yourself home. During the procedure, you will be given medicines that can continue to make you feel drowsy and can impair your reflexes. °· An IV access tube will be started in the arm. °PROCEDURE  °· A medicine to help you relax (sedative) will be given through the IV access tube. °· A medicine may be sprayed or gargled to numb the back of the throat. °· Your blood pressure, heart rate, and breathing (vital signs) will be monitored during the procedure. °· The TEE probe is a long, flexible tube. The tip of the probe is placed into the back of the mouth, and you will be asked to swallow. This helps to pass the tip of the probe into the esophagus. Once the tip of the probe  is in the correct area, your health care provider can take pictures of the heart. °· TEE is usually not a painful procedure. You may feel the probe press against the back of the throat. The probe does not enter the trachea and does not affect your breathing. °AFTER THE PROCEDURE  °· You will be in bed, resting, until you have fully returned to consciousness. °· When you first awaken, your throat may feel slightly sore and will probably still feel numb. This will improve slowly over time. °· You will not be allowed to eat or drink until it is clear that the numbness has improved. °· Once you have been able to drink, urinate, and sit on the edge of the bed without feeling sick to your stomach (nausea) or dizzy, you may be cleared to go home. °· You should have a friend or family member with you for the next 24 hours after your procedure. °Document Released: 06/12/2002 Document Revised: 03/27/2013 Document Reviewed: 09/21/2012 °ExitCare® Patient Information ©2015 [employer]. This information is not intended to replace advice given to you by your health care provider. Make sure you discuss any questions you have with your health care provider. ° °

## 2013-11-28 NOTE — Interval H&P Note (Signed)
History and Physical Interval Note:  11/28/2013 9:02 AM  Erin Rivas  has presented today for surgery, with the diagnosis of ASD   The various methods of treatment have been discussed with the patient and family. After consideration of risks, benefits and other options for treatment, the patient has consented to  Procedure(s): TRANSESOPHAGEAL ECHOCARDIOGRAM (TEE) (N/A) as a surgical intervention .  The patient's history has been reviewed, patient examined, no change in status, stable for surgery.  I have reviewed the patient's chart and labs.  Questions were answered to the patient's satisfaction.     Dorothy Spark

## 2013-11-28 NOTE — Progress Notes (Signed)
  Echocardiogram Echocardiogram Transesophageal has been performed.  Erin Rivas 11/28/2013, 12:08 PM

## 2013-11-28 NOTE — H&P (View-Only) (Signed)
HPI:   53 year old woman presenting for followup evaluation. The patient has a history of a small atrial septal defect. This is been diagnosed by 2-D echo. She has never undergone a TEE. She has a history of migraine headaches. Her last echocardiogram one year ago demonstrated mild right sided cardiac chamber enlargement. The patient has been completely asymptomatic from a cardiac perspective. She returns today for followup evaluation.  She denies chest pain, chest pressure, shortness of breath, edema, or heart palpitations. She's gained 20 pounds since her visit here last year.  Outpatient Encounter Prescriptions as of 11/08/2013  Medication Sig  . aspirin 81 MG tablet Take 81 mg by mouth daily.    . Atorvastatin Calcium (LIPITOR PO) Take 20 mg by mouth daily.   Marland Kitchen levothyroxine (SYNTHROID, LEVOTHROID) 100 MCG tablet Take 100 mcg by mouth daily before breakfast.  . Multiple Vitamin (MULTIVITAMIN PO) Take by mouth.    . [DISCONTINUED] hydrocortisone (ANUSOL-HC) 25 MG suppository Insert one in rectum up tp three times a day as needed for hemorrhoid pain    No Known Allergies  Past Medical History  Diagnosis Date  . Obesity   . ASD (atrial septal defect)     Small  . Hyperlipidemia   . Sinus bradycardia   . Hx of migraines     Would not occur without exertion  . Trace mitral regurgitation by prior echocardiogram 01/31/2006  . Trace tricuspid regurgitation by prior echocardiogram 01/31/2006  . Hypothyroidism    ROS: Negative except as per HPI  BP 134/84  Pulse 46  Ht 5\' 8"  (1.727 m)  Wt 207 lb (93.895 kg)  BMI 31.48 kg/m2  PHYSICAL EXAM: Pt is alert and oriented, NAD HEENT: normal Neck: JVP - normal, carotids 2+= without bruits Lungs: CTA bilaterally CV: Bradycardic and regular with a soft systolic murmur at the left upper sternal border. Abd: soft, NT, Positive BS, no hepatomegaly Ext: no C/C/E, distal pulses intact and equal Skin: warm/dry no rash  EKG:  Marked sinus  bradycardia with incomplete right bundle branch block, heart rate 46 beats per minute.  2D Echo 10/20/2012: Study Conclusions  - Left ventricle: The cavity size was normal. Wall thickness was increased in a pattern of mild LVH. The estimated ejection fraction was 60%. Wall motion was normal; there were no regional wall motion abnormalities. - Left atrium: The atrium was mildly dilated. - Right ventricle: The cavity size was mildly dilated. Systolic function was normal. - Right atrium: The atrium was mildly dilated. - Atrial septum: The history says ASD. 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 There is mild color shunting from left to right. I suspect this is a patent foramen.  ASSESSMENT AND PLAN: 1. Atrial septal defect. We again discussed the natural history of atrial septal defects. I think if her AST is small, we can likely follow her clinically. However, I have some concern that her right-sided cardiac chambers were enlarged on last year's echo. I have recommended a gated cardiac CTA to better evaluate the anatomy of her interatrial septum, pulmonary venous anatomy, and cardiac chamber size. Further plans pending the results of that study. She understands that she may require a TEE for further evaluation pending those results. If her CT scan demonstrates a very small defect, I will plan on seeing her back in one year for followup evaluation. She remains on aspirin for antiplatelet therapy.  2. Hyperlipidemia. Discussed lifestyle modification. The patient is on atorvastatin and is followed by her primary care physician.  Sherren Mocha 11/08/2013 9:29 PM

## 2013-11-29 ENCOUNTER — Encounter (HOSPITAL_COMMUNITY): Payer: Self-pay | Admitting: Cardiology

## 2013-11-30 ENCOUNTER — Encounter (HOSPITAL_COMMUNITY): Payer: Self-pay | Admitting: Cardiovascular Disease

## 2014-02-05 ENCOUNTER — Encounter (HOSPITAL_COMMUNITY): Payer: Self-pay | Admitting: Cardiovascular Disease

## 2014-02-12 ENCOUNTER — Encounter: Payer: Self-pay | Admitting: Cardiovascular Disease

## 2014-07-05 ENCOUNTER — Encounter: Payer: Self-pay | Admitting: Family Medicine

## 2014-07-05 NOTE — Progress Notes (Signed)
Patient ID: Erin Rivas, female   DOB: Jun 19, 1960, 54 y.o.   MRN: [id number] Continued problems with right foot by fasciitis. We had looked at with ultrasound couple of weeks ago. She has been using the orthotics in the nitroglycerin patch and is still having significant pain causing limp. She is ready to consider injection therapy.  INJECTION: Patient was given informed consent, signed copy in the chart. Appropriate time out was taken. Area prepped and draped in usual sterile fashion. 1 cc of methylprednisolone 40 mg/ml plus  1 cc of 1% lidocaine without epinephrine was injected into the area underneath the plantar fascia at its origin using a(n) medial approach. The patient tolerated the procedure well. There were no complications. Post procedure instructions were given.  We'll continue that glycerin patch and see how she does. Follow-up when necessary.

## 2014-12-23 ENCOUNTER — Encounter: Payer: Self-pay | Admitting: Cardiovascular Disease

## 2014-12-26 ENCOUNTER — Ambulatory Visit: Payer: 59 | Admitting: Cardiovascular Disease

## 2015-02-18 ENCOUNTER — Encounter: Payer: Self-pay | Admitting: Cardiovascular Disease

## 2015-02-18 ENCOUNTER — Ambulatory Visit (INDEPENDENT_AMBULATORY_CARE_PROVIDER_SITE_OTHER): Payer: 59 | Admitting: Cardiovascular Disease

## 2015-02-18 VITALS — BP 112/90 | HR 60 | Ht 68.5 in | Wt 213.0 lb

## 2015-02-18 DIAGNOSIS — Q2116 Sinus venosus atrial septal defect, unspecified: Secondary | ICD-10-CM

## 2015-02-18 DIAGNOSIS — Q211 Atrial septal defect: Secondary | ICD-10-CM

## 2015-02-18 NOTE — Progress Notes (Signed)
Cardiology Office Note Date:  02/18/2015   ID:  Lashawanda, Norbeck 12-29-1960, MRN [id number]  PCP:  Lynne Logan, MD  Cardiologist:  Sherren Mocha, MD    Chief Complaint  Patient presents with  . Follow-up    no refills     History of Present Illness: Erin Rivas is a 54 y.o. female who presents for followup evaluation. The patient has a history of a small atrial septal defect, diagnosed by 2-D echo several years ago.  She has a history of migraine headaches.Last year she underwent evaluation with TEE and Gated Cardiac CTA because of right sided cardiac chamber enlargement. The patient has been completely asymptomatic from a cardiac perspective. She returns today for followup evaluation.  The patient has no cardiac complaints. Today, she denies symptoms of palpitations, chest pain, shortness of breath, orthopnea, PND, lower extremity edema, dizziness, or syncope.  She has been inactive over the past year with problems related to plantar fasciitis. Also has had some stressors at home. No other complaints today.   Past Medical History  Diagnosis Date  . Obesity   . ASD (atrial septal defect)     Small  . Hyperlipidemia   . Sinus bradycardia   . Hx of migraines     Would not occur without exertion  . Trace mitral regurgitation by prior echocardiogram 01/31/2006  . Trace tricuspid regurgitation by prior echocardiogram 01/31/2006  . Hypothyroidism     Past Surgical History  Procedure Laterality Date  . Electrocardiogram  05/2008  . Doppler echocardiography  01/31/2006  . Tee without cardioversion N/A 11/28/2013    Procedure: TRANSESOPHAGEAL ECHOCARDIOGRAM (TEE);  Surgeon: Dorothy Spark, MD;  Location[facility] ENDOSCOPY;  Service: Cardiovascular;  Laterality: N/A;    Current Outpatient Prescriptions  Medication Sig Dispense Refill  . aspirin 81 MG tablet Take 81 mg by mouth daily.      Marland Kitchen atorvastatin (LIPITOR) 20 MG tablet Take 1 tablet by mouth daily.  0  .  Multiple Vitamin (MULTIVITAMIN PO) Take by mouth.      . SYNTHROID 112 MCG tablet Take 1 tablet by mouth daily.  0   No current facility-administered medications for this visit.    Allergies:   Review of patient's allergies indicates no known allergies.   Social History:  The patient  reports that she has never smoked. She has never used smokeless tobacco. She reports that she drinks alcohol. She reports that she does not use illicit drugs.   Family History:  The patient's  family history includes COPD in her father; Mitral valve prolapse in her mother.    ROS:  Please see the history of present illness.  Otherwise, review of systems is positive for heel pain.  All other systems are reviewed and negative.    PHYSICAL EXAM: VS:  BP 112/90 mmHg  Pulse 60  Ht 5' 8.5" (1.74 m)  Wt 213 lb (96.616 kg)  BMI 31.91 kg/m2 , BMI Body mass index is 31.91 kg/(m^2). GEN: Well nourished, well developed, in no acute distress HEENT: normal Neck: no JVD, no masses. No carotid bruits Cardiac: RRR without murmur or gallop                Respiratory:  clear to auscultation bilaterally, normal work of breathing GI: soft, nontender, nondistended, + BS MS: no deformity or atrophy Ext: no pretibial edema, pedal pulses 2+= bilaterally Skin: warm and dry, no rash Neuro:  Strength and sensation are intact Psych: euthymic mood, full  affect  EKG:  EKG is ordered today. The ekg ordered today shows NSR with incomplete RBBB  Recent Labs: No results found for requested labs within last 365 days.   Lipid Panel  No results found for: CHOL, TRIG, HDL, CHOLHDL, VLDL, LDLCALC, LDLDIRECT    Wt Readings from Last 3 Encounters:  02/18/15 213 lb (96.616 kg)  11/28/13 207 lb (93.895 kg)  11/08/13 207 lb (93.895 kg)     Cardiac Studies Reviewed: TEE 11-28-13: Study Conclusions  - Left ventricle: The cavity size was normal. There was mild concentric hypertrophy. Systolic function was normal.  The estimated ejection fraction was in the range of 60% to 65%. Wall motion was normal; there were no regional wall motion abnormalities. - Aortic valve: No evidence of vegetation. - Mitral valve: No evidence of vegetation. There was mild regurgitation. - Left atrium: The atrium was mildly dilated. No evidence of thrombus in the atrial cavity or appendage. No evidence of thrombus in the atrial cavity or appendage. - Right ventricle: The cavity size was moderately dilated. Wall thickness was normal. - Right atrium: The atrium was mildly dilated. No evidence of thrombus in the atrial cavity or appendage. - Tricuspid valve: No evidence of vegetation. - Pulmonic valve: No evidence of vegetation.  Impressions:  - There is an atrial septal defect in the infero-posterior portion of the interatrial septum with at least three small left to right jets. No right to left flow was seen. No anomalous pulmonary vein drainage was seen. Right ventricle is moderately dilated and has normal systolic function.. Right atrium is mildly dilated. Left ventricular size and systolic function is normal. Mild mitral and tricuspid regurgitation. RVSP not assessed on current study.  Cardiac CTA: Aorta: Normal size aortic root and ascending aorta.  Aortic Valve: Trileaflet, no calcifications.  Coronary Arteries: Originating in a regular position. Right dominance.  RCA is a large dominance vessel that gives rise to PDA and PLVB. There is no plague.  LM is large and has no plague.  LAD is a large vessel that wraps around the apex. There is no plague in LAD territory. LAD gives rise to two diagonal branches. D1 is large vessel without plague. D2 is smaller vessel with no plague.  LCX is medium size non-dominant vessel that gives rise to one obtuse marginal branch. There is no plague in LCX territory.  There is a large inferior sinus venosus type atrial septal  defect. The defect measures 20 mm in the antero-posterior diameter (4 chamber view or axial view) and 23 mm in the superior-inferior diameter (sagittal view). There are good superior, anterior and posterior margins. Inferiorly the defect continues directly into inferior vena cava with missing inferior margin of the ASD. A left to right flow from left atrium to right atrium and IVC is seen. No anomalous pulmonary vein draining was identified.  There are 4 pulmonary veins in a regular position (2 right and 2 left).  Moderately dilated right atrium and right ventricle. Dilated pulmonary artery measuring 34 x 27 mm.  There is a large left atrial appendage without filling defect.  IMPRESSION: 1. Coronary calcium score of 0. This was 0 percentile for age and sex matched control.  2. Normal origin of coronary arteries. Right dominance. No CAD.  3. A large inferior sinus venosus type ASD measuring 20 x 23 mm that is contiguous with IVC. No anomalous pulmonary vein draining was identified.  4. Dilated right atrium, right ventricle and pulmonary artery.  5. No other congenital  heart anomalies were identified.  Ena Dawley ASSESSMENT AND PLAN: Multifenestrated ASD: pt remains asymptomatic from cardiac perspective, NYHA 1. Will repeat echo this year to evaluate for progressive RV dilatation. May need to do Cardiac MR if right heart not well visualized. I have reviewed CT and TEE studies from last year. CT reports large venosus ASD but appears to have multifenestrated defect with 3 small defects involving the mid-septum and inferoposterior septum.  Current medicines are reviewed with the patient today.  The patient does not have concerns regarding medicines.  Labs/ tests ordered today include:  No orders of the defined types were placed in this encounter.   Disposition:   FU one year  Signed, Sherren Mocha, MD  02/18/2015 8:18 AM    [facility]  [facility] [address], [place], [place]  [address] Phone: [phone]; Fax: [phone]

## 2015-02-18 NOTE — Patient Instructions (Signed)

## 2015-02-19 ENCOUNTER — Encounter: Payer: Self-pay | Admitting: Cardiovascular Disease

## 2015-03-10 ENCOUNTER — Other Ambulatory Visit: Payer: Self-pay

## 2015-03-10 ENCOUNTER — Ambulatory Visit (HOSPITAL_COMMUNITY): Payer: 59 | Attending: Cardiology

## 2015-03-10 DIAGNOSIS — I517 Cardiomegaly: Secondary | ICD-10-CM | POA: Diagnosis not present

## 2015-03-10 DIAGNOSIS — Z6831 Body mass index (BMI) 31.0-31.9, adult: Secondary | ICD-10-CM | POA: Diagnosis not present

## 2015-03-10 DIAGNOSIS — E669 Obesity, unspecified: Secondary | ICD-10-CM | POA: Diagnosis not present

## 2015-03-10 DIAGNOSIS — Q2116 Sinus venosus atrial septal defect, unspecified: Secondary | ICD-10-CM

## 2015-03-10 DIAGNOSIS — E785 Hyperlipidemia, unspecified: Secondary | ICD-10-CM | POA: Diagnosis not present

## 2015-03-10 DIAGNOSIS — Q211 Atrial septal defect: Secondary | ICD-10-CM | POA: Insufficient documentation

## 2015-04-19 ENCOUNTER — Other Ambulatory Visit: Payer: Self-pay | Admitting: Family Medicine

## 2015-04-19 MED ORDER — CYCLOBENZAPRINE HCL 10 MG PO TABS: 10.0000 mg | ORAL_TABLET | Freq: Three times a day (TID) | ORAL | Status: DC | PRN

## 2015-04-21 ENCOUNTER — Other Ambulatory Visit: Payer: Self-pay | Admitting: Family Medicine

## 2015-04-21 DIAGNOSIS — M546 Pain in thoracic spine: Secondary | ICD-10-CM

## 2015-04-21 MED ORDER — HYDROCODONE-ACETAMINOPHEN 5-325 MG PO TABS: ORAL_TABLET | ORAL | Status: DC

## 2015-04-21 MED FILL — HYDROCODON-APAP 5-325: 5-325 | 12 days supply | Qty: 90 | Fill #0

## 2015-04-23 ENCOUNTER — Ambulatory Visit
Admission: RE | Admit: 2015-04-23 | Discharge: 2015-04-23 | Disposition: A | Discharge status: Home / Self Care | Payer: 59 | Source: Ambulatory Visit | Attending: Family Medicine | Admitting: Family Medicine

## 2015-04-23 ENCOUNTER — Other Ambulatory Visit: Payer: Self-pay | Admitting: Family Medicine

## 2015-04-23 DIAGNOSIS — M546 Pain in thoracic spine: Secondary | ICD-10-CM

## 2015-04-23 DIAGNOSIS — M47814 Spondylosis without myelopathy or radiculopathy, thoracic region: Secondary | ICD-10-CM | POA: Diagnosis not present

## 2015-06-16 MED FILL — SYNTHROID 112 MCG TABLET: 112 | 90 days supply | Qty: 90 | Fill #0

## 2015-08-21 MED FILL — IBUPROFEN 800 MG TABLET: 800 | 4 days supply | Qty: 16 | Fill #0

## 2015-09-17 MED FILL — SYNTHROID 112 MCG TABLET: 112 | 90 days supply | Qty: 90 | Fill #1

## 2015-12-18 MED FILL — SYNTHROID 112 MCG TABLET: 112 | 90 days supply | Qty: 90 | Fill #2

## 2016-02-20 ENCOUNTER — Other Ambulatory Visit: Payer: Self-pay | Admitting: Family Medicine

## 2016-02-20 MED ORDER — SYNTHROID 112 MCG PO TABS: 112.0000 ug | ORAL_TABLET | Freq: Every day | ORAL | 3 refills | Status: DC

## 2016-02-20 MED ORDER — ATORVASTATIN CALCIUM 20 MG PO TABS: 20.0000 mg | ORAL_TABLET | Freq: Every day | ORAL | 3 refills | Status: DC

## 2016-02-20 MED FILL — ATORVASTATIN 20 MG TABLET: 20 | 90 days supply | Qty: 90 | Fill #0

## 2016-03-12 DIAGNOSIS — H2513 Age-related nuclear cataract, bilateral: Secondary | ICD-10-CM | POA: Diagnosis not present

## 2016-03-12 DIAGNOSIS — H52223 Regular astigmatism, bilateral: Secondary | ICD-10-CM | POA: Diagnosis not present

## 2016-03-12 DIAGNOSIS — H5203 Hypermetropia, bilateral: Secondary | ICD-10-CM | POA: Diagnosis not present

## 2016-03-12 DIAGNOSIS — H524 Presbyopia: Secondary | ICD-10-CM | POA: Diagnosis not present

## 2016-03-26 MED FILL — SYNTHROID 112 MCG TABLET: 112 | 90 days supply | Qty: 90 | Fill #0

## 2016-05-21 ENCOUNTER — Other Ambulatory Visit: Payer: Self-pay | Admitting: *Deleted

## 2016-05-21 MED ORDER — TRAMADOL HCL 50 MG PO TABS: 50.0000 mg | ORAL_TABLET | Freq: Three times a day (TID) | ORAL | 2 refills | Status: DC | PRN

## 2016-05-21 MED FILL — traMADol HCL 50 MG TABS: 50 | 20 days supply | Qty: 60 | Fill #0

## 2016-07-01 MED FILL — ATORVASTATIN 20 MG TABLET: 20 | 90 days supply | Qty: 90 | Fill #1

## 2016-07-01 MED FILL — SYNTHROID 112 MCG TABLET: 112 | 90 days supply | Qty: 90 | Fill #1

## 2016-09-07 DIAGNOSIS — E7800 Pure hypercholesterolemia, unspecified: Secondary | ICD-10-CM | POA: Diagnosis not present

## 2016-09-07 DIAGNOSIS — M542 Cervicalgia: Secondary | ICD-10-CM | POA: Diagnosis not present

## 2016-09-07 DIAGNOSIS — Z0000 Encounter for general adult medical examination without abnormal findings: Secondary | ICD-10-CM | POA: Diagnosis not present

## 2016-09-07 DIAGNOSIS — R5382 Chronic fatigue, unspecified: Secondary | ICD-10-CM | POA: Diagnosis not present

## 2016-09-07 DIAGNOSIS — E039 Hypothyroidism, unspecified: Secondary | ICD-10-CM | POA: Diagnosis not present

## 2016-09-07 DIAGNOSIS — Z1159 Encounter for screening for other viral diseases: Secondary | ICD-10-CM | POA: Diagnosis not present

## 2016-11-02 MED FILL — ATORVASTATIN 20 MG TABLET: 20 | 90 days supply | Qty: 90 | Fill #2

## 2016-11-02 MED FILL — SYNTHROID 112 MCG TABLET: 112 | 90 days supply | Qty: 90 | Fill #2

## 2017-01-24 DIAGNOSIS — H5203 Hypermetropia, bilateral: Secondary | ICD-10-CM | POA: Diagnosis not present

## 2017-01-24 DIAGNOSIS — H52223 Regular astigmatism, bilateral: Secondary | ICD-10-CM | POA: Diagnosis not present

## 2017-01-24 DIAGNOSIS — H524 Presbyopia: Secondary | ICD-10-CM | POA: Diagnosis not present

## 2017-02-01 MED FILL — ATORVASTATIN 20 MG TABLET: 20 | 90 days supply | Qty: 90 | Fill #3

## 2017-02-01 MED FILL — SYNTHROID 112 MCG TABLET: 112 | 90 days supply | Qty: 90 | Fill #3

## 2017-02-16 DIAGNOSIS — M542 Cervicalgia: Secondary | ICD-10-CM | POA: Diagnosis not present

## 2017-02-22 DIAGNOSIS — M542 Cervicalgia: Secondary | ICD-10-CM | POA: Diagnosis not present

## 2017-05-03 ENCOUNTER — Other Ambulatory Visit: Payer: Self-pay | Admitting: Family Medicine

## 2017-05-09 MED FILL — SYNTHROID 112 MCG TABLET: 112 | 90 days supply | Qty: 90 | Fill #0

## 2017-07-18 ENCOUNTER — Ambulatory Visit: Payer: 59 | Admitting: Cardiology

## 2017-07-21 NOTE — Progress Notes (Signed)
Cardiology Office Note:    Date:  07/22/2017   ID:  Erin Rivas, DOB 1961-04-05, MRN [id number]  PCP:  Donald Prose, MD  Cardiologist:  Sherren Mocha, MD  Referring MD: Donald Prose, MD   Chief Complaint  Patient presents with  . Follow-up    ASD  . Chest Pain    History of Present Illness:    Erin Rivas is a 57 y.o. female with a past medical history significant for small atrial septal defect, migraine headaches, hyperlipidemia, incomplete RBBB, sinus bradycardia and hypothyroidism.  Patient was evaluated in 2015 with TEE and gated cardiac CTA because of right-sided cardiac chamber enlargement and found to have an atrial septal defect. The CT reported a large venosus ASD but actually it appeared to be a multi-fenestrated defect with 3 small defects involving the mid septum and inferior posterior septum.  Dr. Burt Knack conferred with several colleagues and discussed options with the patient including minimally invasive closure of the defect but since she had been asymptomatic, NYHA1, it was decided to treat her medically and observe.  A follow-up transthoracic echocardiogram in 03/2015 showed stable findings with no evidence of pulmonary hypertension or RV dysfunction.  Mild RV dilatation with stable.  There was recommendation for follow-up in 1 year which was not accomplished.  Today Erin Rivas is here for follow up with some new symptoms. She has not been here since 2016. She was doing well until about 6 weeks ago when she started having mild chest pressure or "just a funny feeling in her chest". It is fleeting, lasting for only a few seconds but can come and go for about 30 minutes. Occurs most when laying down or seated, leaning over like watching TV. It was happening about 3-4 times per day for 2-3 weeks and has become less frequent to be about once or twice a week. She has not noticed any shortness of breath with the occurences. She has been doing boxing for exercise since January, 45  minute training sessions and has had no exertional chest discomfort or dyspnea. She denies edema, orthopnea, platypnea, abdominal fullness, or any symptoms of fluid overload.   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 She is also noting mild heaviness of the legs as she cannot kick as high as she feels she should be able to. She wonders if the statin may be contributing.   EKG without significant changes. No murmur audible at rest or with valsalva. Does have split S2. No edema. No JVD.    Past Medical History:  Diagnosis Date  . ASD (atrial septal defect)    Small  . Hx of migraines    Would not occur without exertion  . Hyperlipidemia   . Hypothyroidism   . Obesity   . Sinus bradycardia   . Trace mitral regurgitation by prior echocardiogram 01/31/2006  . Trace tricuspid regurgitation by prior echocardiogram 01/31/2006    Past Surgical History:  Procedure Laterality Date  . DOPPLER ECHOCARDIOGRAPHY  01/31/2006  . ELECTROCARDIOGRAM  05/2008  . TEE WITHOUT CARDIOVERSION N/A 11/28/2013   Procedure: TRANSESOPHAGEAL ECHOCARDIOGRAM (TEE);  Surgeon: Dorothy Spark, MD;  Location: [facility] ENDOSCOPY;  Service: Cardiovascular;  Laterality: N/A;    Current Medications: Current Meds  Medication Sig  . aspirin 81 MG tablet Take 81 mg by mouth as needed for pain.   Marland Kitchen atorvastatin (LIPITOR) 20 MG tablet Take 1 tablet (20 mg total) by mouth daily.  . cyclobenzaprine (FLEXERIL) 10 MG tablet Take 1 tablet (10 mg total)  by mouth 3 (three) times daily as needed for muscle spasms.  Marland Kitchen HYDROcodone-acetaminophen (NORCO/VICODIN) 5-325 MG tablet Take by mouth 1-2 tablets every 6-8 hours prnthoracic  back pain  . Multiple Vitamin (MULTIVITAMIN PO) Take by mouth.    . SYNTHROID 112 MCG tablet Take 1 tablet (112 mcg total) by mouth daily.  . traMADol (ULTRAM) 50 MG tablet Take 1 tablet (50 mg total) by mouth every 8 (eight) hours as needed.  . [DISCONTINUED] atorvastatin (LIPITOR) 20 MG tablet Take 1 tablet (20  mg total) by mouth daily.     Allergies:   Patient has no known allergies.   Social History   Socioeconomic History  . Marital status: Married    Spouse name: Not on file  . Number of children: 0  . Years of education: Not on file  . Highest education level: Not on file  Occupational History  . Occupation: [employer]    Employer: [employer]  . Financial resource strain: Not on file  . Food insecurity:    Worry: Not on file    Inability: Not on file  . Transportation needs:    Medical: Not on file    Non-medical: Not on file  Tobacco Use  . Smoking status: Never Smoker  . Smokeless tobacco: Never Used  Substance and Sexual Activity  . Alcohol use: Yes    Comment: 1-2 per month  . Drug use: No    Types: Marijuana    Comment: quit 1981 -as a teenager  . Sexual activity: Not on file  Lifestyle  . Physical activity:    Days per week: Not on file    Minutes per session: Not on file  . Stress: Not on file  Relationships  . Social connections:    Talks on phone: Not on file    Gets together: Not on file    Attends religious service: Not on file    Active member of club or organization: Not on file    Attends meetings of clubs or organizations: Not on file    Relationship status: Not on file  Other Topics Concern  . Not on file  Social History Narrative   Married and lives at home with her husband   No children   Reasonably active and will walk to work     Family History: The patient's family history includes COPD in her father; Mitral valve prolapse in her mother. ROS:   Please see the history of present illness.     All other systems reviewed and are negative.  EKGs/Labs/Other Studies Reviewed:    The following studies were reviewed today:  Transthoracic echocardiogram 03/10/2015 Study Conclusions  - Left ventricle: The cavity size was normal. Systolic function was   normal. The estimated ejection fraction was in the range of  60%   to 65%. Wall motion was normal; there were no regional wall   motion abnormalities. There was an increased relative   contribution of atrial contraction to ventricular filling.   Doppler parameters are consistent with abnormal left ventricular   relaxation (grade 1 diastolic dysfunction). - Right ventricle: The cavity size was mildly dilated. Wall   thickness was normal. - Atrial septum: There was a patent foramen ovale.   TEE 11-28-13: Study Conclusions - Left ventricle: The cavity size was normal. There was mild concentric hypertrophy. Systolic function was normal. The estimated ejection fraction was in the range of 60% to 65%. Wall motion was normal; there were  no regional wall motion abnormalities. - Aortic valve: No evidence of vegetation. - Mitral valve: No evidence of vegetation. There was mild regurgitation. - Left atrium: The atrium was mildly dilated. No evidence of thrombus in the atrial cavity or appendage. No evidence of thrombus in the atrial cavity or appendage. - Right ventricle: The cavity size was moderately dilated. Wall thickness was normal. - Right atrium: The atrium was mildly dilated. No evidence of thrombus in the atrial cavity or appendage. - Tricuspid valve: No evidence of vegetation. - Pulmonic valve: No evidence of vegetation.  Impressions: - There is an atrial septal defect in the infero-posterior portion of the interatrial septum with at least three small left to right jets. No right to left flow was seen. No anomalous pulmonary vein drainage was seen. Right ventricle is moderately dilated and has normal systolic function.. Right atrium is mildly dilated. Left ventricular size and systolic function is normal. Mild mitral and tricuspid regurgitation. RVSP not assessed on current study.  Cardiac CTA: Aorta: Normal size aortic root and ascending aorta. Aortic Valve: Trileaflet, no  calcifications. Coronary Arteries: Originating in a regular position. Right dominance. RCA is a large dominance vessel that gives rise to PDA and PLVB. There is no plague. LM is large and has no plague. LAD is a large vessel that wraps around the apex. There is no plague in LAD territory. LAD gives rise to two diagonal branches. D1 is large vessel without plague. D2 is smaller vessel with no plague. LCX is medium size non-dominant vessel that gives rise to one obtuse marginal branch. There is no plague in LCX territory. There is a large inferior sinus venosus type atrial septal defect. The defect measures 20 mm in the antero-posterior diameter (4 chamber view or axial view) and 23 mm in the superior-inferior diameter (sagittal view). There are good superior, anterior and posterior margins. Inferiorly the defect continues directly into inferior vena cava with missing inferior margin of the ASD. A left to right flow from left atrium to right atrium and IVC is seen. No anomalous pulmonary vein draining was identified. There are 4 pulmonary veins in a regular position (2 right and 2 left). Moderately dilated right atrium and right ventricle. Dilated pulmonary artery measuring 34 x 27 mm. There is a large left atrial appendage without filling defect.  IMPRESSION: 1. Coronary calcium score of 0. This was 0 percentile for age and sex matched control. 2. Normal origin of coronary arteries. Right dominance. No CAD. 3. A large inferior sinus venosus type ASD measuring 20 x 23 mm that is contiguous with IVC. No anomalous pulmonary vein draining was identified. 4. Dilated right atrium, right ventricle and pulmonary artery. 5. No other congenital heart anomalies were identified.   EKG:  EKG is  ordered today.  The ekg ordered today demonstrates Sinus bradycardia, 59 bpm, incomplete RBBB, QRS 102, QTC 473.  No significant change from previous.   Recent Labs: No results found for requested  labs within last 8760 hours.   Recent Lipid Panel No results found for: CHOL, TRIG, HDL, CHOLHDL, VLDL, LDLCALC, LDLDIRECT  Physical Exam:    VS:  BP 130/80 (BP Location: Left Arm, Patient Position: Sitting, Cuff Size: Normal)   Pulse (!) 59   Ht 5\' 8"  (1.727 m)   Wt 215 lb (97.5 kg)   SpO2 97% Comment: sitting and lying  BMI 32.69 kg/m     Wt Readings from Last 3 Encounters:  07/22/17 215 lb (97.5 kg)  02/18/15 213  lb (96.6 kg)  11/28/13 207 lb (93.9 kg)     Physical Exam  Constitutional: She is oriented to person, place, and time. She appears well-developed and well-nourished.  HENT:  Head: Normocephalic and atraumatic.  Neck: Normal range of motion. Neck supple. No JVD present.  Cardiovascular: Normal rate, regular rhythm, S2 normal and intact distal pulses.  Split S2  Pulmonary/Chest: Effort normal and breath sounds normal. No respiratory distress. She has no wheezes. She has no rales.  Abdominal: Soft. Bowel sounds are normal.  Musculoskeletal: Normal range of motion. She exhibits no edema.  Neurological: She is alert and oriented to person, place, and time.  Skin: Skin is warm and dry.  Psychiatric: She has a normal mood and affect. Her behavior is normal. Thought content normal.    ASSESSMENT:    1. Atrial septal defect, sinus venosus   2. Dyslipidemia    PLAN:    In order of problems listed above:  Multi-fenestrated ASD: Prior studies showed 3 small defects with mild RV dilitation and no evidence of pulmonary hypertension. Last evaluation was in 2016. Surgery was deferred in favor of monitoring. She is having some new very brief mild chest pressure worse with lying down, no dyspnea or indications of volume overload. Is very active with no exertional symptoms. She is bradycardic and may get a transient right to left shunt that causes her symptoms at rest when her diastolic pressures are low but her breathing is not affected. She currently has no S/S of right heart  failure. Will check echo to evaluate for progressive RV dilatation.  Per Dr. Burt Knack may need cardiac MRI if right heart not well visualized. I will send this note to Dr. Burt Knack for his input on any additional testing.   I reassured the patient that her discomfort is very not likely due to a coronary blockage, had normal coronaries and calcium score 0 on CCTA and that since she is not having any heart failure type symptoms she is stable. I reviewed S/S of heart failure with the patient and what to report.   Hyperlipidemia: Managed by her PCP, on Lipitor 20 mg daily. Having possible leg pain. Her calcium score was 0. We discussed either switching to a better tolerated statin like Crestor at low dose 5-10 mg or using diet and exercise for cholesterol management. I have no lipid levels available. She will discuss with her PCP.   Medication Adjustments/Labs and Tests Ordered: Current medicines are reviewed at length with the patient today.  Concerns regarding medicines are outlined above. Labs and tests ordered and medication changes are outlined in the patient instructions below:  Patient Instructions  Medication Instructions:  Your physician recommends that you continue on your current medications as directed. Please refer to the Current Medication list given to you today. 1. Lipitor was sent in today to patient's requested pharmacy.   Labwork: -None  Testing/Procedures: Your physician has requested that you have an echocardiogram. Echocardiography is a painless test that uses sound waves to create images of your heart. It provides your doctor with information about the size and shape of your heart and how well your heart's chambers and valves are working. This procedure takes approximately one hour. There are no restrictions for this procedure.    Follow-Up: Your physician wants you to follow-up in: 1 year with Dr. Burt Knack.  You will receive a reminder letter in the mail two months in advance. If  you don't receive a letter, please call our office to  schedule the follow-up appointment.   Any Other Special Instructions Will Be Listed Below (If Applicable).     If you need a refill on your cardiac medications before your next appointment, please call your pharmacy.      Signed, Daune Perch, NP  07/22/2017 9:02 AM    [place]

## 2017-07-22 ENCOUNTER — Encounter: Payer: Self-pay | Admitting: Cardiology

## 2017-07-22 ENCOUNTER — Ambulatory Visit: Payer: 59 | Admitting: Cardiology

## 2017-07-22 VITALS — BP 130/80 | HR 59 | Ht 68.0 in | Wt 215.0 lb

## 2017-07-22 DIAGNOSIS — Q2116 Sinus venosus atrial septal defect, unspecified: Secondary | ICD-10-CM

## 2017-07-22 DIAGNOSIS — E785 Hyperlipidemia, unspecified: Secondary | ICD-10-CM

## 2017-07-22 DIAGNOSIS — Q211 Atrial septal defect: Secondary | ICD-10-CM

## 2017-07-22 MED ORDER — ATORVASTATIN CALCIUM 20 MG PO TABS: 20.0000 mg | ORAL_TABLET | Freq: Every day | ORAL | 3 refills | Status: DC

## 2017-07-22 NOTE — Patient Instructions (Signed)
Medication Instructions:  Your physician recommends that you continue on your current medications as directed. Please refer to the Current Medication list given to you today. 1. Lipitor was sent in today to patient's requested pharmacy.   Labwork: -None  Testing/Procedures: Your physician has requested that you have an echocardiogram. Echocardiography is a painless test that uses sound waves to create images of your heart. It provides your doctor with information about the size and shape of your heart and how well your heart's chambers and valves are working. This procedure takes approximately one hour. There are no restrictions for this procedure.    Follow-Up: Your physician wants you to follow-up in: 1 year with Dr. Burt Knack.  You will receive a reminder letter in the mail two months in advance. If you don't receive a letter, please call our office to schedule the follow-up appointment.   Any Other Special Instructions Will Be Listed Below (If Applicable).     If you need a refill on your cardiac medications before your next appointment, please call your pharmacy.

## 2017-07-26 ENCOUNTER — Ambulatory Visit (HOSPITAL_COMMUNITY): Payer: 59 | Attending: Cardiovascular Disease

## 2017-07-26 ENCOUNTER — Other Ambulatory Visit: Payer: Self-pay

## 2017-07-26 DIAGNOSIS — I517 Cardiomegaly: Secondary | ICD-10-CM | POA: Diagnosis not present

## 2017-07-26 DIAGNOSIS — E785 Hyperlipidemia, unspecified: Secondary | ICD-10-CM | POA: Insufficient documentation

## 2017-07-26 DIAGNOSIS — Q2116 Sinus venosus atrial septal defect, unspecified: Secondary | ICD-10-CM

## 2017-07-26 DIAGNOSIS — Q211 Atrial septal defect: Secondary | ICD-10-CM | POA: Diagnosis not present

## 2017-08-04 MED FILL — ATORVASTATIN 20 MG TABLET: 20 | 90 days supply | Qty: 90 | Fill #0

## 2017-08-04 MED FILL — SYNTHROID 112 MCG TABLET: 112 | 90 days supply | Qty: 90 | Fill #1

## 2017-11-01 ENCOUNTER — Other Ambulatory Visit: Payer: Self-pay

## 2017-11-01 DIAGNOSIS — I421 Obstructive hypertrophic cardiomyopathy: Secondary | ICD-10-CM

## 2017-11-01 DIAGNOSIS — I517 Cardiomegaly: Secondary | ICD-10-CM

## 2017-11-09 ENCOUNTER — Ambulatory Visit (HOSPITAL_COMMUNITY)
Admission: RE | Admit: 2017-11-09 | Discharge: 2017-11-09 | Disposition: A | Discharge status: Home / Self Care | Payer: 59 | Source: Ambulatory Visit | Attending: Cardiovascular Disease | Admitting: Cardiovascular Disease

## 2017-11-09 DIAGNOSIS — I421 Obstructive hypertrophic cardiomyopathy: Secondary | ICD-10-CM | POA: Insufficient documentation

## 2017-11-09 DIAGNOSIS — Q211 Atrial septal defect: Secondary | ICD-10-CM | POA: Insufficient documentation

## 2017-11-09 LAB — CREATININE, SERUM
Creatinine, Ser: 1.05 mg/dL — ABNORMAL HIGH (ref 0.44–1.00)
GFR calc non Af Amer: 57 mL/min — ABNORMAL LOW (ref >60)

## 2017-11-09 MED ORDER — GADOBENATE DIMEGLUMINE 529 MG/ML IV SOLN
30.0000 mL | Freq: Once | INTRAVENOUS | Status: AC
  Administered 2017-11-09: 30 mL via INTRAVENOUS

## 2017-11-11 ENCOUNTER — Telehealth: Payer: Self-pay

## 2017-11-11 MED FILL — SYNTHROID 112 MCG TABLET: 112 | 90 days supply | Qty: 90 | Fill #0

## 2017-11-11 NOTE — Telephone Encounter (Signed)
-----   Message from Nelva Bush, MD sent at 11/10/2017  9:41 PM EDT ----- Reasonable creatinine, at baseline.  OK to proceed with cardiac MRI.

## 2017-11-11 NOTE — Telephone Encounter (Signed)
-----   Message from Nelva Bush, MD sent at 11/10/2017  9:45 PM EDT ----- Cardiac MRI shows likely sinus venosis type ASD with biatrial enlargement (left > right) and modest shunt fraction.  I will defer further management/workup to Dr. Burt Knack when he returns.

## 2017-11-11 NOTE — Telephone Encounter (Signed)
Notes recorded by Frederik Schmidt, RN on 11/11/2017 at 8:26 AM EDT [id number] 8/9 ------

## 2017-11-11 NOTE — Telephone Encounter (Signed)
Notes recorded by Frederik Schmidt, RN on 11/11/2017 at 8:41 AM EDT Informed patient of results/recommendations. She verbalized understanding. ------

## 2017-11-24 ENCOUNTER — Encounter: Payer: Self-pay | Admitting: Cardiovascular Disease

## 2017-11-24 ENCOUNTER — Ambulatory Visit: Payer: 59 | Admitting: Cardiovascular Disease

## 2017-11-24 VITALS — BP 118/84 | HR 61 | Ht 68.0 in | Wt 218.0 lb

## 2017-11-24 DIAGNOSIS — I517 Cardiomegaly: Secondary | ICD-10-CM | POA: Diagnosis not present

## 2017-11-24 DIAGNOSIS — Q211 Atrial septal defect, unspecified: Secondary | ICD-10-CM

## 2017-11-24 MED FILL — ATORVASTATIN CALCIUM 20 MG: 20 | 90 days supply | Qty: 90 | Fill #1

## 2017-11-24 NOTE — Progress Notes (Signed)
Cardiology Office Note Date:  11/24/2017   ID:  Alesi, Zachery 1960-10-01, MRN [id number]  PCP:  Donald Prose, MD  Cardiologist:  Sherren Mocha, MD    Chief Complaint  Patient presents with  . Shortness of Breath     History of Present Illness: Erin Rivas is a 57 y.o. female who presents for follow-up of a multi-fenestrated ASD.   The patient has a history of ASD diagnosed by echo several years ago.  In 2015 she underwent evaluation with a TEE and CTA of the heart.  At that time she was felt to have 3 separate atrial septal defects in the mid as well as the inferoposterior portion of the atrial septum.  She is been managed medically because of asymptomatic status and what is felt to be relatively small left to right shunt.  She more recently was evaluated and was found to have severe left atrial enlargement.  Cardiac MRI was recommended and demonstrated biatrial enlargement, mild RV enlargement, and poor spatial resolution of the interatrial septum with a QP: QS ratio of 1.4-1.  The patient is here alone today.  She complains of some shortness of breath with physical activity.  She otherwise has no symptoms with any normal activities.  She denies chest pain, orthopnea, PND, or leg swelling.  She is had no lightheadedness, heart palpitations, or syncope.   Past Medical History:  Diagnosis Date  . ASD (atrial septal defect)    Small  . Hx of migraines    Would not occur without exertion  . Hyperlipidemia   . Hypothyroidism   . Obesity   . Sinus bradycardia   . Trace mitral regurgitation by prior echocardiogram 01/31/2006  . Trace tricuspid regurgitation by prior echocardiogram 01/31/2006    Past Surgical History:  Procedure Laterality Date  . DOPPLER ECHOCARDIOGRAPHY  01/31/2006  . ELECTROCARDIOGRAM  05/2008  . TEE WITHOUT CARDIOVERSION N/A 11/28/2013   Procedure: TRANSESOPHAGEAL ECHOCARDIOGRAM (TEE);  Surgeon: Dorothy Spark, MD;  Location: [facility] ENDOSCOPY;   Service: Cardiovascular;  Laterality: N/A;    Current Outpatient Medications  Medication Sig Dispense Refill  . atorvastatin (LIPITOR) 20 MG tablet Take 1 tablet (20 mg total) by mouth daily. 90 tablet 3  . SYNTHROID 112 MCG tablet Take 1 tablet (112 mcg total) by mouth daily. 90 tablet 3  . aspirin 81 MG tablet Take 81 mg by mouth as needed for pain.      No current facility-administered medications for this visit.     Allergies:   Patient has no known allergies.   Social History:  The patient  reports that she has never smoked. She has never used smokeless tobacco. She reports that she drinks alcohol. She reports that she does not use drugs.   Family History:  The patient's  family history includes COPD in her father; Mitral valve prolapse in her mother.   ROS:  Please see the history of present illness.  Otherwise, review of systems is positive for snoring.  All other systems are reviewed and negative.   PHYSICAL EXAM: VS:  BP 118/84   Pulse 61   Ht 5\' 8"  (1.727 m)   Wt 218 lb (98.9 kg)   SpO2 96%   BMI 33.15 kg/m  , BMI Body mass index is 33.15 kg/m. GEN: Well nourished, well developed, in no acute distress  HEENT: normal  Neck: no JVD, no masses. No carotid bruits Cardiac: RRR with a 1/6 systolic murmur at the LLSB  Respiratory:  clear to auscultation bilaterally, normal work of breathing GI: soft, nontender, nondistended, + BS MS: no deformity or atrophy  Ext: no pretibial edema, pedal pulses 2+= bilaterally Skin: warm and dry, no rash Neuro:  Strength and sensation are intact Psych: euthymic mood, full affect  EKG:  EKG is not ordered today.  Recent Labs: 11/09/2017: Creatinine, Ser 1.05   Lipid Panel  No results found for: CHOL, TRIG, HDL, CHOLHDL, VLDL, LDLCALC, LDLDIRECT    Wt Readings from Last 3 Encounters:  11/24/17 218 lb (98.9 kg)  07/22/17 215 lb (97.5 kg)  02/18/15 213 lb (96.6 kg)     Cardiac Studies Reviewed: Cardiac MRI  11-09-2017: FINDINGS: Limited images of the lung fields showed no gross abnormalities.  Normal left ventricular size and wall thickness, EF 59% with normal wall motion. Mildly dilated right ventricle with normal systolic function, EF 19%. Severely dilated left atrium. Mildly dilated right atrium. There is an atrial septal defect present inferiorly in the interatrial septum, there appears to be left to right flow. This appears to be an inferior sinus venosus-type defect. Resolution for assessing this defect is poor by MRI given motion of the septum, morphology would be better-viewed by TEE. MRA to assess pulmonary veins was not done, but there does not appear to be anomalous pulmonary venous return. Trileaflet aortic valve with no stenosis or regurgitation. No significant mitral regurgitation noted. Mild tricuspid regurgitation.  On delayed enhancement imaging, there was no myocardial late gadolinium enhancement (LGE).  Measurements:  Aortic forward volume 84 mL  Pulmonic forward volume 100 mL  LVEDV 132  LVSV 78 mL  LVEF 59%  RVEDV 182 mL RVSV 109 mL  RVEF 60%  Qp/Qs using stroke volume = 1.4  Qp/Qs using forward flows across aortic and pulmonary valves = 1.2  Stroke volume Qp/Qs is probably more accurate => minimal AV valve regurgitation and pulmonic flow sequences were not set up well.  IMPRESSION: 1.  Normal LV size with EF 59%.  2.  Mildly dilated RV with EF 60%.  3.  Severely dilated LA, mildly dilated RA.  4. ASD in the inferior interatrial septum adjacent to IVC. Suspect sinus venosus-type ASD. No anomalous pulmonary venous return seen, though MRA chest was not done (most useful for assess anomalous pulmonary veins). The morpohology of this ASD is not well-visualized on MRI due to limited temporal resolution of the mobile interatrial septum, would be better visualized by TEE.  5. Qp/Qs using stroke volume was 1.4. This is likely fairly  accurate as there appeared to be minimal AV valve regurgitation.  TEE 11-28-2013: Impressions:  - There is an atrial septal defect in the infero-posterior portion of the interatrial septum with at least three small left to right jets. No right to left flow was seen. No anomalous pulmonary vein drainage was seen. Right ventricle is moderately dilated and has normal systolic function.. Right atrium is mildly dilated. Left ventricular size and systolic function is normal. Mild mitral and tricuspid regurgitation. RVSP not assessed on current study.  Cardiac CTA 2015: IMPRESSION: 1. Coronary calcium score of 0. This was 0 percentile for age and sex matched control.  2. Normal origin of coronary arteries.  Right dominance.  No CAD.  3. A large inferior sinus venosus type ASD measuring 20 x 23 mm that is contiguous with IVC. No anomalous pulmonary vein draining was identified.  4. Dilated right atrium, right ventricle and pulmonary artery.  5. No other congenital heart anomalies were identified.  ASSESSMENT AND PLAN: ASD, multi-fenestrated: pt with mild exertional dyspnea, otherwise asymptomatic.  I reviewed the patient's previous studies from 2015.  She now has worsening left atrial enlargement and fairly stable right heart chamber size.  I think it would be helpful to get more information about the current state of her atrial septal defects as well as the hemodynamic significance of her ASD's.  Her cardiac MRI is reviewed and TEE is recommended for better spatial resolution.  I recommended an updated TEE study.  I reviewed risks, indications, and alternatives with the patient.  I have also recommended a right heart catheterization with shunt run to determine hemodynamic significance of her ASD and degree of left-to-right shunt. Risks of right heart catheterization are also reviewed with the patient.  The studies will be arranged on the same day and I will follow-up with  her after they are completed.  Considerations would be ongoing observation, consideration of transcatheter closure with multiple devices, or surgical ASD repair.  Current medicines are reviewed with the patient today.  The patient does not have concerns regarding medicines.  Labs/ tests ordered today include:   Orders Placed This Encounter  Procedures  . Basic metabolic panel  . CBC with Differential/Platelet    Disposition:   FU pending TEE result  Signed, Sherren Mocha, MD  11/24/2017 5:20 PM    [facility] [place], [place], [place]  [address] Phone: [phone]; Fax: [phone]

## 2017-11-24 NOTE — Patient Instructions (Addendum)
Medication Instructions:  Your provider recommends that you continue on your current medications as directed. Please refer to the Current Medication list given to you today.    Labwork: TODAY: BMET, CBC  Testing/Procedures: Your physician has requested that you have a TEE. During a TEE, sound waves are used to create images of your heart. It provides your doctor with information about the size and shape of your heart and how well your heart's chambers and valves are working. In this test, a transducer is attached to the end of a flexible tube that's guided down your throat and into your esophagus (the tube leading from you mouth to your stomach) to get a more detailed image of your heart. You are not awake for the procedure. Please see the instruction sheet given to you today. For further information please visit [email or web address].  Your physician has requested that you have a cardiac catheterization. Cardiac catheterization is used to diagnose and/or treat various heart conditions. Doctors may recommend this procedure for a number of different reasons. The most common reason is to evaluate chest pain. Chest pain can be a symptom of coronary artery disease (CAD), and cardiac catheterization can show whether plaque is narrowing or blocking your heart's arteries. This procedure is also used to evaluate the valves, as well as measure the blood flow and oxygen levels in different parts of your heart. For further information please visit [email or web address]. Please follow instruction sheet, as given.  Follow-Up: You have an appointment with Dr. Burt Knack on Thursday, September 19 at 4:20PM.  Any Other Special Instructions Will Be Listed Below (If Applicable).    [facility] [place], [place] [address] Dept: [phone] Loc: [place]  11/24/2017  You are scheduled for a  Cardiac Catheterization on Friday, September 13 with Dr. Daneen Schick.  1. This procedure will occur directly after your TEE.  2. Diet: Do not eat solid foods after midnight.  The patient may have clear liquids until 5am upon the day of the procedure.  3. Labs: today!  4. Medication instructions in preparation for your procedure:  1) MAKE SURE TO TAKE YOUR ASPIRIN  2) you may take your other medications as directed with sips of water.  5. Plan for one night stay--bring personal belongings. 6. Bring a current list of your medications and current insurance cards. 7. You MUST have a responsible person to drive you home. 8. Someone MUST be with you the first 24 hours after you arrive home or your discharge will be delayed. 9. Please wear clothes that are easy to get on and off and wear slip-on shoes.  Thank you for allowing [place] to care for you!   -- [facility]

## 2017-11-24 NOTE — H&P (View-Only) (Signed)
Cardiology Office Note Date:  11/24/2017   ID:  Erin, Rivas 1961/03/05, MRN [id number]  PCP:  Donald Prose, MD  Cardiologist:  Sherren Mocha, MD    Chief Complaint  Patient presents with  . Shortness of Breath     History of Present Illness: Erin Rivas is a 57 y.o. female who presents for follow-up of a multi-fenestrated ASD.   The patient has a history of ASD diagnosed by echo several years ago.  In 2015 she underwent evaluation with a TEE and CTA of the heart.  At that time she was felt to have 3 separate atrial septal defects in the mid as well as the inferoposterior portion of the atrial septum.  She is been managed medically because of asymptomatic status and what is felt to be relatively small left to right shunt.  She more recently was evaluated and was found to have severe left atrial enlargement.  Cardiac MRI was recommended and demonstrated biatrial enlargement, mild RV enlargement, and poor spatial resolution of the interatrial septum with a QP: QS ratio of 1.4-1.  The patient is here alone today.  She complains of some shortness of breath with physical activity.  She otherwise has no symptoms with any normal activities.  She denies chest pain, orthopnea, PND, or leg swelling.  She is had no lightheadedness, heart palpitations, or syncope.   Past Medical History:  Diagnosis Date  . ASD (atrial septal defect)    Small  . Hx of migraines    Would not occur without exertion  . Hyperlipidemia   . Hypothyroidism   . Obesity   . Sinus bradycardia   . Trace mitral regurgitation by prior echocardiogram 01/31/2006  . Trace tricuspid regurgitation by prior echocardiogram 01/31/2006    Past Surgical History:  Procedure Laterality Date  . DOPPLER ECHOCARDIOGRAPHY  01/31/2006  . ELECTROCARDIOGRAM  05/2008  . TEE WITHOUT CARDIOVERSION N/A 11/28/2013   Procedure: TRANSESOPHAGEAL ECHOCARDIOGRAM (TEE);  Surgeon: Dorothy Spark, MD;  Location: [facility] ENDOSCOPY;   Service: Cardiovascular;  Laterality: N/A;    Current Outpatient Medications  Medication Sig Dispense Refill  . atorvastatin (LIPITOR) 20 MG tablet Take 1 tablet (20 mg total) by mouth daily. 90 tablet 3  . SYNTHROID 112 MCG tablet Take 1 tablet (112 mcg total) by mouth daily. 90 tablet 3  . aspirin 81 MG tablet Take 81 mg by mouth as needed for pain.      No current facility-administered medications for this visit.     Allergies:   Patient has no known allergies.   Social History:  The patient  reports that she has never smoked. She has never used smokeless tobacco. She reports that she drinks alcohol. She reports that she does not use drugs.   Family History:  The patient's  family history includes COPD in her father; Mitral valve prolapse in her mother.   ROS:  Please see the history of present illness.  Otherwise, review of systems is positive for snoring.  All other systems are reviewed and negative.   PHYSICAL EXAM: VS:  BP 118/84   Pulse 61   Ht 5\' 8"  (1.727 m)   Wt 218 lb (98.9 kg)   SpO2 96%   BMI 33.15 kg/m  , BMI Body mass index is 33.15 kg/m. GEN: Well nourished, well developed, in no acute distress  HEENT: normal  Neck: no JVD, no masses. No carotid bruits Cardiac: RRR with a 1/6 systolic murmur at the LLSB  Respiratory:  clear to auscultation bilaterally, normal work of breathing GI: soft, nontender, nondistended, + BS MS: no deformity or atrophy  Ext: no pretibial edema, pedal pulses 2+= bilaterally Skin: warm and dry, no rash Neuro:  Strength and sensation are intact Psych: euthymic mood, full affect  EKG:  EKG is not ordered today.  Recent Labs: 11/09/2017: Creatinine, Ser 1.05   Lipid Panel  No results found for: CHOL, TRIG, HDL, CHOLHDL, VLDL, LDLCALC, LDLDIRECT    Wt Readings from Last 3 Encounters:  11/24/17 218 lb (98.9 kg)  07/22/17 215 lb (97.5 kg)  02/18/15 213 lb (96.6 kg)     Cardiac Studies Reviewed: Cardiac MRI  11-09-2017: FINDINGS: Limited images of the lung fields showed no gross abnormalities.  Normal left ventricular size and wall thickness, EF 59% with normal wall motion. Mildly dilated right ventricle with normal systolic function, EF 24%. Severely dilated left atrium. Mildly dilated right atrium. There is an atrial septal defect present inferiorly in the interatrial septum, there appears to be left to right flow. This appears to be an inferior sinus venosus-type defect. Resolution for assessing this defect is poor by MRI given motion of the septum, morphology would be better-viewed by TEE. MRA to assess pulmonary veins was not done, but there does not appear to be anomalous pulmonary venous return. Trileaflet aortic valve with no stenosis or regurgitation. No significant mitral regurgitation noted. Mild tricuspid regurgitation.  On delayed enhancement imaging, there was no myocardial late gadolinium enhancement (LGE).  Measurements:  Aortic forward volume 84 mL  Pulmonic forward volume 100 mL  LVEDV 132  LVSV 78 mL  LVEF 59%  RVEDV 182 mL RVSV 109 mL  RVEF 60%  Qp/Qs using stroke volume = 1.4  Qp/Qs using forward flows across aortic and pulmonary valves = 1.2  Stroke volume Qp/Qs is probably more accurate => minimal AV valve regurgitation and pulmonic flow sequences were not set up well.  IMPRESSION: 1.  Normal LV size with EF 59%.  2.  Mildly dilated RV with EF 60%.  3.  Severely dilated LA, mildly dilated RA.  4. ASD in the inferior interatrial septum adjacent to IVC. Suspect sinus venosus-type ASD. No anomalous pulmonary venous return seen, though MRA chest was not done (most useful for assess anomalous pulmonary veins). The morpohology of this ASD is not well-visualized on MRI due to limited temporal resolution of the mobile interatrial septum, would be better visualized by TEE.  5. Qp/Qs using stroke volume was 1.4. This is likely fairly  accurate as there appeared to be minimal AV valve regurgitation.  TEE 11-28-2013: Impressions:  - There is an atrial septal defect in the infero-posterior portion of the interatrial septum with at least three small left to right jets. No right to left flow was seen. No anomalous pulmonary vein drainage was seen. Right ventricle is moderately dilated and has normal systolic function.. Right atrium is mildly dilated. Left ventricular size and systolic function is normal. Mild mitral and tricuspid regurgitation. RVSP not assessed on current study.  Cardiac CTA 2015: IMPRESSION: 1. Coronary calcium score of 0. This was 0 percentile for age and sex matched control.  2. Normal origin of coronary arteries.  Right dominance.  No CAD.  3. A large inferior sinus venosus type ASD measuring 20 x 23 mm that is contiguous with IVC. No anomalous pulmonary vein draining was identified.  4. Dilated right atrium, right ventricle and pulmonary artery.  5. No other congenital heart anomalies were identified.  ASSESSMENT AND PLAN: ASD, multi-fenestrated: pt with mild exertional dyspnea, otherwise asymptomatic.  I reviewed the patient's previous studies from 2015.  She now has worsening left atrial enlargement and fairly stable right heart chamber size.  I think it would be helpful to get more information about the current state of her atrial septal defects as well as the hemodynamic significance of her ASD's.  Her cardiac MRI is reviewed and TEE is recommended for better spatial resolution.  I recommended an updated TEE study.  I reviewed risks, indications, and alternatives with the patient.  I have also recommended a right heart catheterization with shunt run to determine hemodynamic significance of her ASD and degree of left-to-right shunt. Risks of right heart catheterization are also reviewed with the patient.  The studies will be arranged on the same day and I will follow-up with  her after they are completed.  Considerations would be ongoing observation, consideration of transcatheter closure with multiple devices, or surgical ASD repair.  Current medicines are reviewed with the patient today.  The patient does not have concerns regarding medicines.  Labs/ tests ordered today include:   Orders Placed This Encounter  Procedures  . Basic metabolic panel  . CBC with Differential/Platelet    Disposition:   FU pending TEE result  Signed, Sherren Mocha, MD  11/24/2017 5:20 PM    [facility] [place], [place], [place]  [address] Phone: [phone]; Fax: [phone]

## 2017-11-25 LAB — BASIC METABOLIC PANEL
BUN / CREAT RATIO: 18 (ref 9–23)
BUN: 17 mg/dL (ref 6–24)
CO2: 24 mmol/L (ref 20–29)
CREATININE: 0.95 mg/dL (ref 0.57–1.00)
Calcium: 9.3 mg/dL (ref 8.7–10.2)
Chloride: 102 mmol/L (ref 96–106)
GFR, EST AFRICAN AMERICAN: 76 mL/min/{1.73_m2} (ref >59)
GFR, EST NON AFRICAN AMERICAN: 66 mL/min/{1.73_m2} (ref >59)
Glucose: 86 mg/dL (ref 65–99)
POTASSIUM: 4.1 mmol/L (ref 3.5–5.2)
SODIUM: 139 mmol/L (ref 134–144)

## 2017-11-25 LAB — CBC WITH DIFFERENTIAL/PLATELET
BASOS: 1 %
Basophils Absolute: 0 10*3/uL (ref 0.0–0.2)
EOS (ABSOLUTE): 0.1 10*3/uL (ref 0.0–0.4)
EOS: 2 %
HEMATOCRIT: 40.2 % (ref 34.0–46.6)
Hemoglobin: 14 g/dL (ref 11.1–15.9)
IMMATURE GRANULOCYTES: 0 %
Immature Grans (Abs): 0 10*3/uL (ref 0.0–0.1)
Lymphocytes Absolute: 2.6 10*3/uL (ref 0.7–3.1)
Lymphs: 36 %
MCH: 29 pg (ref 26.6–33.0)
MCHC: 34.8 g/dL (ref 31.5–35.7)
MCV: 83 fL (ref 79–97)
MONOS ABS: 0.7 10*3/uL (ref 0.1–0.9)
Monocytes: 9 %
NEUTROS ABS: 3.8 10*3/uL (ref 1.4–7.0)
NEUTROS PCT: 52 %
PLATELETS: 299 10*3/uL (ref 150–450)
RBC: 4.82 x10E6/uL (ref 3.77–5.28)
RDW: 14.4 % (ref 12.3–15.4)
WBC: 7.3 10*3/uL (ref 3.4–10.8)

## 2017-12-07 ENCOUNTER — Encounter: Payer: Self-pay | Admitting: Cardiovascular Disease

## 2017-12-15 ENCOUNTER — Telehealth: Payer: Self-pay | Admitting: *Deleted

## 2017-12-15 NOTE — Telephone Encounter (Signed)
Pt contacted pre-catheterization scheduled at [facility] for: Friday December 16, 2017 9 AM/TEE 8AM Verified arrival time and place: [place] Entrance A at: 6:30 AM  Nothing to eat or drink after midnight prior to TEE/cath. Verified allergies in Epic  AM meds can be  taken pre-cath with sip of water including: ASA 81 mg  Confirmed patient has responsible person to drive home post procedure and for 24 hours after you arrive home: yes

## 2017-12-16 ENCOUNTER — Encounter (HOSPITAL_COMMUNITY)
Admission: RE | Disposition: A | Discharge status: Home / Self Care | Payer: Self-pay | Source: Ambulatory Visit | Attending: Cardiology

## 2017-12-16 ENCOUNTER — Ambulatory Visit (HOSPITAL_BASED_OUTPATIENT_CLINIC_OR_DEPARTMENT_OTHER)
Admission: RE | Admit: 2017-12-16 | Discharge: 2017-12-16 | Disposition: A | Discharge status: Home / Self Care | Payer: 59 | Source: Ambulatory Visit | Attending: Cardiovascular Disease | Admitting: Cardiovascular Disease

## 2017-12-16 ENCOUNTER — Ambulatory Visit (HOSPITAL_COMMUNITY)
Admission: RE | Disposition: A | Discharge status: Home / Self Care | Payer: Self-pay | Source: Ambulatory Visit | Attending: Cardiology

## 2017-12-16 ENCOUNTER — Ambulatory Visit (HOSPITAL_COMMUNITY)
Admission: RE | Admit: 2017-12-16 | Discharge: 2017-12-16 | Disposition: A | Discharge status: Home / Self Care | Payer: 59 | Source: Ambulatory Visit | Attending: Cardiology | Admitting: Cardiology

## 2017-12-16 ENCOUNTER — Other Ambulatory Visit: Payer: Self-pay

## 2017-12-16 ENCOUNTER — Encounter (HOSPITAL_COMMUNITY): Payer: Self-pay | Admitting: *Deleted

## 2017-12-16 DIAGNOSIS — Z7982 Long term (current) use of aspirin: Secondary | ICD-10-CM | POA: Diagnosis not present

## 2017-12-16 DIAGNOSIS — Q211 Atrial septal defect, unspecified: Secondary | ICD-10-CM

## 2017-12-16 DIAGNOSIS — E669 Obesity, unspecified: Secondary | ICD-10-CM | POA: Diagnosis not present

## 2017-12-16 DIAGNOSIS — Z6833 Body mass index (BMI) 33.0-33.9, adult: Secondary | ICD-10-CM | POA: Insufficient documentation

## 2017-12-16 DIAGNOSIS — E785 Hyperlipidemia, unspecified: Secondary | ICD-10-CM | POA: Insufficient documentation

## 2017-12-16 DIAGNOSIS — E039 Hypothyroidism, unspecified: Secondary | ICD-10-CM | POA: Diagnosis not present

## 2017-12-16 DIAGNOSIS — G43909 Migraine, unspecified, not intractable, without status migrainosus: Secondary | ICD-10-CM | POA: Insufficient documentation

## 2017-12-16 HISTORY — PX: RIGHT HEART CATH: CATH118263

## 2017-12-16 HISTORY — PX: TEE WITHOUT CARDIOVERSION: SHX5443

## 2017-12-16 LAB — POCT I-STAT 3, VENOUS BLOOD GAS (G3P V)
ACID-BASE DEFICIT: 1 mmol/L (ref 0.0–2.0)
Acid-Base Excess: 1 mmol/L (ref 0.0–2.0)
Acid-base deficit: 1 mmol/L (ref 0.0–2.0)
BICARBONATE: 25.4 mmol/L (ref 20.0–28.0)
BICARBONATE: 27 mmol/L (ref 20.0–28.0)
BICARBONATE: 27 mmol/L (ref 20.0–28.0)
Bicarbonate: 26 mmol/L (ref 20.0–28.0)
Bicarbonate: 26 mmol/L (ref 20.0–28.0)
Bicarbonate: 26.2 mmol/L (ref 20.0–28.0)
Bicarbonate: 26.5 mmol/L (ref 20.0–28.0)
Bicarbonate: 26.6 mmol/L (ref 20.0–28.0)
O2 SAT: 71 %
O2 SAT: 73 %
O2 SAT: 80 %
O2 SAT: 83 %
O2 Saturation: 75 %
O2 Saturation: 76 %
O2 Saturation: 78 %
O2 Saturation: 81 %
PCO2 VEN: 48.4 mmHg (ref 44.0–60.0)
PCO2 VEN: 49.3 mmHg (ref 44.0–60.0)
PH VEN: 7.31 (ref 7.250–7.430)
PH VEN: 7.325 (ref 7.250–7.430)
PH VEN: 7.358 (ref 7.250–7.430)
PO2 VEN: 42 mmHg (ref 32.0–45.0)
PO2 VEN: 47 mmHg — AB (ref 32.0–45.0)
TCO2: 27 mmol/L (ref 22–32)
TCO2: 27 mmol/L (ref 22–32)
TCO2: 28 mmol/L (ref 22–32)
TCO2: 29 mmol/L (ref 22–32)
TCO2: 27 mmol/L (ref 22–32)
TCO2: 28 mmol/L (ref 22–32)
TCO2: 28 mmol/L (ref 22–32)
TCO2: 28 mmol/L (ref 22–32)
pCO2, Ven: 48 mmHg (ref 44.0–60.0)
pCO2, Ven: 49.7 mmHg (ref 44.0–60.0)
pCO2, Ven: 49.8 mmHg (ref 44.0–60.0)
pCO2, Ven: 50.2 mmHg (ref 44.0–60.0)
pCO2, Ven: 52.6 mmHg (ref 44.0–60.0)
pCO2, Ven: 54.1 mmHg (ref 44.0–60.0)
pH, Ven: 7.306 (ref 7.250–7.430)
pH, Ven: 7.317 (ref 7.250–7.430)
pH, Ven: 7.325 (ref 7.250–7.430)
pH, Ven: 7.338 (ref 7.250–7.430)
pH, Ven: 7.339 (ref 7.250–7.430)
pO2, Ven: 42 mmHg (ref 32.0–45.0)
pO2, Ven: 44 mmHg (ref 32.0–45.0)
pO2, Ven: 45 mmHg (ref 32.0–45.0)
pO2, Ven: 46 mmHg — ABNORMAL HIGH (ref 32.0–45.0)
pO2, Ven: 47 mmHg — ABNORMAL HIGH (ref 32.0–45.0)
pO2, Ven: 51 mmHg — ABNORMAL HIGH (ref 32.0–45.0)

## 2017-12-16 SURGERY — RIGHT HEART CATH

## 2017-12-16 SURGERY — ECHOCARDIOGRAM, TRANSESOPHAGEAL
Anesthesia: Moderate Sedation

## 2017-12-16 MED ORDER — SODIUM CHLORIDE 0.9 % IV SOLN: 250.0000 mL | INTRAVENOUS | Status: DC | PRN

## 2017-12-16 MED ORDER — BUTAMBEN-TETRACAINE-BENZOCAINE 2-2-14 % EX AERO
INHALATION_SPRAY | CUTANEOUS | Status: DC | PRN
  Administered 2017-12-16: 2 via TOPICAL

## 2017-12-16 MED ORDER — FENTANYL CITRATE (PF) 100 MCG/2ML IJ SOLN
INTRAMUSCULAR | Status: DC | PRN
  Administered 2017-12-16 (×2): 25 ug via INTRAVENOUS

## 2017-12-16 MED ORDER — FENTANYL CITRATE (PF) 100 MCG/2ML IJ SOLN
INTRAMUSCULAR | Status: DC | PRN
  Administered 2017-12-16: 25 ug via INTRAVENOUS

## 2017-12-16 MED ORDER — ASPIRIN 81 MG PO CHEW: 81.0000 mg | CHEWABLE_TABLET | ORAL | Status: DC

## 2017-12-16 MED ORDER — FENTANYL CITRATE (PF) 100 MCG/2ML IJ SOLN
INTRAMUSCULAR | Status: AC
  Filled 2017-12-16: qty 2

## 2017-12-16 MED ORDER — SODIUM CHLORIDE 0.9% FLUSH: 3.0000 mL | INTRAVENOUS | Status: DC | PRN

## 2017-12-16 MED ORDER — SODIUM CHLORIDE 0.9% FLUSH: 3.0000 mL | Freq: Two times a day (BID) | INTRAVENOUS | Status: DC

## 2017-12-16 MED ORDER — MIDAZOLAM HCL 5 MG/ML IJ SOLN
INTRAMUSCULAR | Status: AC
  Filled 2017-12-16: qty 2

## 2017-12-16 MED ORDER — HEPARIN SODIUM (PORCINE) 1000 UNIT/ML IJ SOLN
INTRAMUSCULAR | Status: AC
  Filled 2017-12-16: qty 1

## 2017-12-16 MED ORDER — ACETAMINOPHEN 325 MG PO TABS: 650.0000 mg | ORAL_TABLET | ORAL | Status: DC | PRN

## 2017-12-16 MED ORDER — SODIUM CHLORIDE 0.9 % WEIGHT BASED INFUSION: 1.0000 mL/kg/h | INTRAVENOUS | Status: DC

## 2017-12-16 MED ORDER — MIDAZOLAM HCL 2 MG/2ML IJ SOLN
INTRAMUSCULAR | Status: AC
  Filled 2017-12-16: qty 2

## 2017-12-16 MED ORDER — HEPARIN (PORCINE) IN NACL 1000-0.9 UT/500ML-% IV SOLN
INTRAVENOUS | Status: DC | PRN
  Administered 2017-12-16: 500 mL

## 2017-12-16 MED ORDER — LIDOCAINE HCL (PF) 1 % IJ SOLN
INTRAMUSCULAR | Status: AC
  Filled 2017-12-16: qty 30

## 2017-12-16 MED ORDER — MIDAZOLAM HCL 10 MG/2ML IJ SOLN
INTRAMUSCULAR | Status: DC | PRN
  Administered 2017-12-16 (×2): 2 mg via INTRAVENOUS

## 2017-12-16 MED ORDER — SODIUM CHLORIDE 0.9 % IV SOLN
INTRAVENOUS | Status: DC
  Administered 2017-12-16: 07:00:00 via INTRAVENOUS

## 2017-12-16 MED ORDER — MIDAZOLAM HCL 2 MG/2ML IJ SOLN
INTRAMUSCULAR | Status: DC | PRN
  Administered 2017-12-16: 1 mg via INTRAVENOUS

## 2017-12-16 MED ORDER — HEPARIN (PORCINE) IN NACL 1000-0.9 UT/500ML-% IV SOLN
INTRAVENOUS | Status: AC
  Filled 2017-12-16: qty 1000

## 2017-12-16 MED ORDER — SODIUM CHLORIDE 0.9 % IV SOLN: INTRAVENOUS | Status: DC

## 2017-12-16 MED ORDER — LIDOCAINE HCL (PF) 1 % IJ SOLN
INTRAMUSCULAR | Status: DC | PRN
  Administered 2017-12-16: 2 mL

## 2017-12-16 MED ORDER — ONDANSETRON HCL 4 MG/2ML IJ SOLN: 4.0000 mg | Freq: Four times a day (QID) | INTRAMUSCULAR | Status: DC | PRN

## 2017-12-16 MED ORDER — SODIUM CHLORIDE 0.9 % WEIGHT BASED INFUSION: 3.0000 mL/kg/h | INTRAVENOUS | Status: AC

## 2017-12-16 SURGICAL SUPPLY — 9 items
CATH BALLN WEDGE 5F 110CM (CATHETERS) ×3 IMPLANT
GUIDEWIRE .025 260CM (WIRE) ×3 IMPLANT
PACK CARDIAC CATHETERIZATION (CUSTOM PROCEDURE TRAY) ×3 IMPLANT
PROTECTION STATION PRESSURIZED (MISCELLANEOUS) ×3
SHEATH GLIDE SLENDER 4/5FR (SHEATH) ×3 IMPLANT
STATION PROTECTION PRESSURIZED (MISCELLANEOUS) ×1 IMPLANT
TRANSDUCER W/STOPCOCK (MISCELLANEOUS) ×3 IMPLANT
TUBING ART PRESS 72  MALE/FEM (TUBING) ×2
TUBING ART PRESS 72 MALE/FEM (TUBING) ×1 IMPLANT

## 2017-12-16 NOTE — Discharge Instructions (Signed)
Right Hear Catheterization, Care After This sheet gives you information about how to care for yourself after your procedure. Your health care provider may also give you more specific instructions. If you have problems or questions, contact your health care provider. What can I expect after the procedure? After the procedure, it is common to have:  Bruising or mild discomfort in the area where the IV was inserted (insertion site).  Follow these instructions at home: Eating and drinking  Follow instructions from your health care provider about eating or drinking restrictions.  Drink a lot of fluids for the first several days after the procedure, as directed by your health care provider. This helps to wash (flush) the contrast out of your body. Examples of healthy fluids include water or low-calorie drinks. General instructions  Check your IV insertion area every day for signs of infection. Check for: ? Redness, swelling, or pain. ? Fluid or blood. ? Warmth. ? Pus or a bad smell.  Take over-the-counter and prescription medicines only as told by your health care provider.  Rest and return to your normal activities as told by your health care provider. Ask your health care provider what activities are safe for you.  Do not drive for 24 hours if you were given a medicine to help you relax (sedative), or until your health care provider approves.  Keep all follow-up visits as told by your health care provider. This is important. Contact a health care provider if:  Your skin becomes itchy or you develop a rash or hives.  You have a fever that does not get better with medicine.  You feel nauseous.  You vomit.  You have redness, swelling, or pain around the insertion site.  You have fluid or blood coming from the insertion site.  Your insertion area feels warm to the touch.  You have pus or a bad smell coming from the insertion site. Get help right away if:  You have difficulty  breathing or shortness of breath.  You develop chest pain.  You faint.  You feel very dizzy. These symptoms may represent a serious problem that is an emergency. Do not wait to see if the symptoms will go away. Get medical help right away. Call your local emergency services (911 in the [place] Do not drive yourself to the hospital. Summary  After your procedure, it is common to have bruising or mild discomfort in the area where the IV was inserted.  You should check your IV insertion area every day for signs of infection.  Take over-the-counter and prescription medicines only as told by your health care provider.  You should drink a lot of fluids for the first several days after the procedure to help flush the contrast from your body. This information is not intended to replace advice given to you by your health care provider. Make sure you discuss any questions you have with your health care provider. Document Released: 01/10/2013 Document Revised: 02/14/2016 Document Reviewed: 02/14/2016 Elsevier Interactive Patient Education  2017 [employer].

## 2017-12-16 NOTE — Interval H&P Note (Signed)
History and Physical Interval Note:  12/16/2017 8:18 AM  Erin Rivas  has presented today for surgery, with the diagnosis of AFD  The various methods of treatment have been discussed with the patient and family. After consideration of risks, benefits and other options for treatment, the patient has consented to  Procedure(s): TRANSESOPHAGEAL ECHOCARDIOGRAM (TEE) (N/A) as a surgical intervention .  The patient's history has been reviewed, patient examined, no change in status, stable for surgery.  I have reviewed the patient's chart and labs.  Questions were answered to the patient's satisfaction.     Anjenette Gerbino [employer]

## 2017-12-16 NOTE — Interval H&P Note (Signed)
Cath Lab Visit (complete for each Cath Lab visit)  Clinical Evaluation Leading to the Procedure:   ACS: No.  Non-ACS:    Anginal Classification: CCS III  Anti-ischemic medical therapy: No Therapy  Non-Invasive Test Results: No non-invasive testing performed  Prior CABG: No previous CABG      History and Physical Interval Note:  12/16/2017 9:54 AM  Erin Rivas  has presented today for surgery, with the diagnosis of asd  The various methods of treatment have been discussed with the patient and family. After consideration of risks, benefits and other options for treatment, the patient has consented to  Procedure(s): RIGHT HEART CATH (N/A) as a surgical intervention .  The patient's history has been reviewed, patient examined, no change in status, stable for surgery.  I have reviewed the patient's chart and labs.  Questions were answered to the patient's satisfaction.     Belva Crome III

## 2017-12-16 NOTE — Op Note (Signed)
Right heart cath performed via right antecubital vein with balloon-tipped catheter wire assisted entry into the inferior vena cava.  Oximetry run:  Superior vena cava 71%  High right atrium 75%  Low right atrium 73%  Inferior vena cava 80%  RV inflow 81%  RV outflow 76%  Main PA 78%  Right main pulmonary artery 80%  Arterial 100% (taken from peripheral oximetry)  Calculated mixed venous O2 saturation 74%  QP/QS = 1.3  No complications

## 2017-12-16 NOTE — Progress Notes (Signed)
  Echocardiogram Echocardiogram Transesophageal has been performed.  Erin Rivas M 12/16/2017, 9:09 AM

## 2017-12-16 NOTE — CV Procedure (Signed)
Procedure: TEE  Indication: Atrial septal defect  Sedation: Versed 4 mg IV, Fentanyl 50 mcg IV  Findings: Please see echo section for full report.  Normal LV size with mild LV hypertrophy.  EF 55-60% with normal wall motion.  The right ventricle was moderately dilated with mildly decreased systolic function.  The left and right atria were moderately dilated.  There was was no LA appendage thrombus.  Pulmonary veins appeared to drain normally to the left atrium.  There was an atrial septal defect present.  The defect was inferior and posterior, near the IVC ostium.  Looking at the 3D images, it does not appear to be a sinus venosus defect, more likely very inferior/posterior secundum ASD (I do not think it involves the IVC ostium).  There were 3 jets in close proximity.  Interestingly, at the site of the ASD, there appeared to be an incomplete double membrane. The IVC was dilated to around 3 cm.  There was trivial TR, unable to estimate PA systolic pressure.  There was trivial mitral regurgitation.  Trileaflet aortic valve without stenosis or regurgitation.  Normal caliber aorta without significant plaque.   Impression: There appears to be a very low/posterior secundum ASD (think probably not sinus venosus, does not appear to involve IVC ostium).  There were 3 separate jets. May be difficult to close percutaneously given proximity to IVC.  The RV, RA, and LA appeared dilated.    Erin Rivas 12/16/2017 8:53 AM

## 2017-12-18 ENCOUNTER — Encounter (HOSPITAL_COMMUNITY): Payer: Self-pay | Admitting: Cardiology

## 2017-12-19 MED FILL — Heparin Sod (Porcine)-NaCl IV Soln 1000 Unit/500ML-0.9%: INTRAVENOUS | Qty: 500 | Status: AC

## 2017-12-22 ENCOUNTER — Encounter: Payer: Self-pay | Admitting: Cardiovascular Disease

## 2017-12-22 ENCOUNTER — Ambulatory Visit: Payer: 59 | Admitting: Cardiovascular Disease

## 2017-12-22 VITALS — BP 112/70 | HR 59 | Ht 68.0 in | Wt 218.1 lb

## 2017-12-22 DIAGNOSIS — Q211 Atrial septal defect, unspecified: Secondary | ICD-10-CM

## 2017-12-22 NOTE — Progress Notes (Signed)
Cardiology Office Note:    Date:  12/24/2017   ID:  Erin Rivas, DOB 11-23-1960, MRN [id number]  PCP:  Donald Prose, MD  Cardiologist:  Sherren Mocha, MD  Electrophysiologist:  None   Referring MD: Donald Prose, MD   Chief Complaint  Patient presents with  . Shortness of Breath    History of Present Illness:    Erin Rivas is a 57 y.o. female with a hx of ASD presenting for follow-up evaluation.   The patient has a history of ASD diagnosed by echo several years ago.  In 2015 she underwent evaluation with a TEE and CTA of the heart.  At that time she was felt to have 3 separate atrial septal defects in the mid as well as the inferoposterior portion of the atrial septum.  She is been managed medically because of asymptomatic status and what is felt to be relatively small left to right shunt.  She more recently was evaluated and was found to have severe left atrial enlargement.  Cardiac MRI was recommended and demonstrated biatrial enlargement, mild RV enlargement, and poor spatial resolution of the interatrial septum with a QP: QS ratio of 1.4:1. She was seen 11/24/2017, complained of exertional dyspnea, and we discussed further evaluation. She was referred for TEE and R heart catheterization. Cardiac cath demonstrated a QP:QS of 1.3:1 and normal right heart pressures. TEE showed a multifenestrated ASD with at least 3 separate defects in the mid and inferior septum near the IVC. Pulmonary venous drainage has been demonstrated to be normal.   She is here with her husband today. She complains of mild DOE and fatigue. Otherwise no chest pain or pressure, edema, or palpitations.   Past Medical History:  Diagnosis Date  . ASD (atrial septal defect)    Small  . Hx of migraines    Would not occur without exertion  . Hyperlipidemia   . Hypothyroidism   . Obesity   . Sinus bradycardia   . Trace mitral regurgitation by prior echocardiogram 01/31/2006  . Trace tricuspid regurgitation by  prior echocardiogram 01/31/2006    Past Surgical History:  Procedure Laterality Date  . DOPPLER ECHOCARDIOGRAPHY  01/31/2006  . ELECTROCARDIOGRAM  05/2008  . RIGHT HEART CATH N/A 12/16/2017   Procedure: RIGHT HEART CATH;  Surgeon: Belva Crome, MD;  Location: [facility];  Service: Cardiovascular;  Laterality: N/A;  . TEE WITHOUT CARDIOVERSION N/A 11/28/2013   Procedure: TRANSESOPHAGEAL ECHOCARDIOGRAM (TEE);  Surgeon: Dorothy Spark, MD;  Location: [facility];  Service: Cardiovascular;  Laterality: N/A;  . TEE WITHOUT CARDIOVERSION N/A 12/16/2017   Procedure: TRANSESOPHAGEAL ECHOCARDIOGRAM (TEE);  Surgeon: Larey Dresser, MD;  Location: [facility];  Service: Cardiovascular;  Laterality: N/A;    Current Medications: Current Meds  Medication Sig  . aspirin 81 MG tablet Take 81 mg by mouth daily at 12 noon.   Marland Kitchen atorvastatin (LIPITOR) 20 MG tablet Take 1 tablet (20 mg total) by mouth daily.  . naproxen sodium (ALEVE) 220 MG tablet Take 220-440 mg by mouth 2 (two) times daily as needed (pain).  . SYNTHROID 112 MCG tablet Take 1 tablet (112 mcg total) by mouth daily.     Allergies:   Patient has no known allergies.   Social History   Socioeconomic History  . Marital status: Married    Spouse name: Not on file  . Number of children: 0  . Years of education: Not on file  . Highest education level: Not on file  Occupational History  . Occupation: [employer]    Employer: [employer]  . Financial resource strain: Not on file  . Food insecurity:    Worry: Not on file    Inability: Not on file  . Transportation needs:    Medical: Not on file    Non-medical: Not on file  Tobacco Use  . Smoking status: Never Smoker  . Smokeless tobacco: Never Used  Substance and Sexual Activity  . Alcohol use: Yes    Comment: 1-2 per month  . Drug use: No    Types: Marijuana    Comment: quit 1981 -as a teenager  . Sexual activity: Not on file  Lifestyle   . Physical activity:    Days per week: Not on file    Minutes per session: Not on file  . Stress: Not on file  Relationships  . Social connections:    Talks on phone: Not on file    Gets together: Not on file    Attends religious service: Not on file    Active member of club or organization: Not on file    Attends meetings of clubs or organizations: Not on file    Relationship status: Not on file  Other Topics Concern  . Not on file  Social History Narrative   Married and lives at home with her husband   No children   Reasonably active and will walk to work     Family History: The patient's family history includes COPD in her father; Mitral valve prolapse in her mother.  ROS:   Please see the history of present illness.    All other systems reviewed and are negative.  EKGs/Labs/Other Studies Reviewed:    The following studies were reviewed today: Echo 07-26-2017: Left ventricle:  The cavity size was normal. Wall thickness was normal. Systolic function was normal. The estimated ejection fraction was in the range of 60% to 65%. Wall motion was normal; there were no regional wall motion abnormalities. Doppler parameters are consistent with abnormal left ventricular relaxation (grade 1 diastolic dysfunction). There was no evidence of elevated ventricular filling pressure by Doppler parameters.  ------------------------------------------------------------------- Aortic valve:   Trileaflet.  Doppler:   There was no stenosis. There was no regurgitation.  ------------------------------------------------------------------- Aorta:  Aortic root: The aortic root was normal in size. Ascending aorta: The ascending aorta was normal in size.  ------------------------------------------------------------------- Mitral valve:   Normal thickness leaflets .  Doppler:  There was trivial regurgitation.    Valve area by pressure half-time: 3.28 cm^2. Indexed valve area by pressure  half-time: 1.49 cm^2/m^2.  ------------------------------------------------------------------- Left atrium:  The atrium was severely dilated.  ------------------------------------------------------------------- Atrial septum:  No defect or patent foramen ovale was identified.   ------------------------------------------------------------------- Right ventricle:  The cavity size was normal. Wall thickness was normal. Systolic function was normal.  ------------------------------------------------------------------- Pulmonic valve:    The valve appears to be grossly normal. Doppler:  There was no significant regurgitation.  ------------------------------------------------------------------- Tricuspid valve:   Structurally normal valve.   Leaflet separation was normal.  Doppler:  Transvalvular velocity was within the normal range. There was no regurgitation.  ------------------------------------------------------------------- Right atrium:  The atrium was normal in size.  ------------------------------------------------------------------- Pericardium:  There was no pericardial effusion.  ------------------------------------------------------------------- Systemic veins: Inferior vena cava: The vessel was normal in size. The respirophasic diameter changes were in the normal range (>= 50%), consistent with normal central venous pressure.  Right heart catheterization: Conclusion     Hemodynamic  findings consistent with pulmonary hypertension.    ASD with multi-site shunting documented by TEE.  Normal pulmonary artery pressure.  Normal pulmonary capillary wedge pressure.  Clinical features suggesting Sleep Apnea  QP/QS: 1.3  Oximetry run:  Superior vena cava 71%  High right atrium 75%  Low right atrium 73%  Inferior vena cava 80%  RV inflow 81%  RV outflow 76%  Main PA 78%  Right main pulmonary artery 80%  Arterial 100% (taken from peripheral  oximetry)  Calculated mixed venous O2 saturation 74%  QP/QS=1.3   RECOMMENDATIONS:   Consider sleep study  No indication for antiplatelet therapy   TEE: Findings: Please see echo section for full report.  Normal LV size with mild LV hypertrophy.  EF 55-60% with normal wall motion.  The right ventricle was moderately dilated with mildly decreased systolic function.  The left and right atria were moderately dilated.  There was was no LA appendage thrombus.  Pulmonary veins appeared to drain normally to the left atrium.  There was an atrial septal defect present.  The defect was inferior and posterior, near the IVC ostium.  Looking at the 3D images, it does not appear to be a sinus venosus defect, more likely very inferior/posterior secundum ASD (I do not think it involves the IVC ostium).  There were 3 jets in close proximity.  Interestingly, at the site of the ASD, there appeared to be an incomplete double membrane. The IVC was dilated to around 3 cm.  There was trivial TR, unable to estimate PA systolic pressure.  There was trivial mitral regurgitation.  Trileaflet aortic valve without stenosis or regurgitation.  Normal caliber aorta without significant plaque.   Impression: There appears to be a very low/posterior secundum ASD (think probably not sinus venosus, does not appear to involve IVC ostium).  There were 3 separate jets. May be difficult to close percutaneously given proximity to IVC.  The RV, RA, and LA appeared dilated.     EKG:  EKG is not ordered today.    Recent Labs: 11/24/2017: BUN 17; Creatinine, Ser 0.95; Hemoglobin 14.0; Platelets 299; Potassium 4.1; Sodium 139  Recent Lipid Panel No results found for: CHOL, TRIG, HDL, CHOLHDL, VLDL, LDLCALC, LDLDIRECT  Physical Exam:    VS:  BP 112/70   Pulse (!) 59   Ht 5\' 8"  (1.727 m)   Wt 218 lb 1.9 oz (98.9 kg)   SpO2 96%   BMI 33.17 kg/m     Wt Readings from Last 3 Encounters:  12/22/17 218 lb 1.9 oz (98.9 kg)    12/16/17 218 lb 0.6 oz (98.9 kg)  11/24/17 218 lb (98.9 kg)     GEN: Well nourished, well developed in no acute distress HEENT: Normal NECK: No JVD See recent exam MUSCULOSKELETAL:  No edema; No deformity  SKIN: Warm and dry NEUROLOGIC:  Alert and oriented x 3 PSYCHIATRIC:  Normal affect   ASSESSMENT:    1. ASD (atrial septal defect)    PLAN:    In order of problems listed above:  1. I have reviewed her extensive imaging workup. The patient has a multifenestrated ASD, secundum type, with at least one defect in an inferoposterior location. She has NYHA II symptoms. There is severe LA enlargement of somewhat unclear etiology. There is also RV enlargement by cardiac MRI. I would favor consideration of ASD closure. We discussed pros and cons of transcatheter versus surgical closure. I am concerned about inability to achieve complete closure with transcatheter techniques based on  the location of the most inferior defect. I am going to refer the patient for an adult congenital cardiology consultation with Dr Corine Shelter for another opinion prior to referring her for surgical closure. This is discussed at length with the patient and her husband today.    Medication Adjustments/Labs and Tests Ordered: Current medicines are reviewed at length with the patient today.  Concerns regarding medicines are outlined above.  Orders Placed This Encounter  Procedures  . Ambulatory referral to Cardiology   No orders of the defined types were placed in this encounter.   Patient Instructions  Medication Instructions:  Your provider recommends that you continue on your current medications as directed. Please refer to the Current Medication list given to you today.    Labwork: None  Testing/Procedures: None  Follow-Up: You have been referred to Dr. Jeralyn Bennett. His office will give you a call with an appointment.   Any Other Special Instructions Will Be Listed Below (If  Applicable).     If you need a refill on your cardiac medications before your next appointment, please call your pharmacy.      Signed, Sherren Mocha, MD  12/24/2017 8:40 AM    [place]

## 2017-12-22 NOTE — Patient Instructions (Addendum)
Medication Instructions:  Your provider recommends that you continue on your current medications as directed. Please refer to the Current Medication list given to you today.    Labwork: None  Testing/Procedures: None  Follow-Up: You have been referred to Dr. Jeralyn Bennett. His office will give you a call with an appointment.   Any Other Special Instructions Will Be Listed Below (If Applicable).     If you need a refill on your cardiac medications before your next appointment, please call your pharmacy.

## 2017-12-24 ENCOUNTER — Encounter: Payer: Self-pay | Admitting: Cardiovascular Disease

## 2017-12-27 ENCOUNTER — Telehealth: Payer: Self-pay | Admitting: Cardiovascular Disease

## 2017-12-27 NOTE — Telephone Encounter (Signed)
Took CT CD to Dr. Dillon Bjork office. Confirmed with them they also have TEE on CD as well.  They state they have called the patient to schedule appointment and are waiting for her to return call to arrange.

## 2017-12-27 NOTE — Telephone Encounter (Signed)
Spoke to [place] in [place] echo department. She will burn TEE on to a disk and bring to Dr. Dillon Bjork office.  Spoke to CT department. Will go pick up CD with CT and bring to Dr. Dillon Bjork office.  Dr. Dillon Bjork office notified.

## 2017-12-27 NOTE — Telephone Encounter (Signed)
New message   Per Estill Bamberg a referral was sent today and she states in comments that it says will burn CD with TEE and CT images. Estill Bamberg needs to know if this will be burnt and brought to the office of if you can push it into [facility] so that Dr. Corine Shelter can see images. Please advise.

## 2018-01-26 NOTE — Telephone Encounter (Signed)
Picked up cMRI CD and dropped of at Dr. Dillon Bjork office.  The patient is scheduled to see him 11/13.

## 2018-02-10 MED FILL — SYNTHROID 112 MCG TABLET: 112 | 90 days supply | Qty: 90 | Fill #1

## 2018-02-15 DIAGNOSIS — E785 Hyperlipidemia, unspecified: Secondary | ICD-10-CM | POA: Diagnosis not present

## 2018-02-15 DIAGNOSIS — Q211 Atrial septal defect: Secondary | ICD-10-CM | POA: Diagnosis not present

## 2018-02-15 DIAGNOSIS — I517 Cardiomegaly: Secondary | ICD-10-CM | POA: Diagnosis not present

## 2018-02-15 DIAGNOSIS — R0609 Other forms of dyspnea: Secondary | ICD-10-CM | POA: Diagnosis not present

## 2018-03-09 ENCOUNTER — Encounter: Payer: Self-pay | Admitting: Cardiovascular Disease

## 2018-03-09 NOTE — Progress Notes (Signed)
I had a lengthy discussion with the patient over the telephone.  We discussed further treatment options of her fenestrated atrial septal defect.  She has been evaluated at [facility] with consideration of transcatheter closure.  The patient prefers to have the procedure done at [facility]  I have reviewed her TEE findings extensively and feel comfortable proceeding with transcatheter ASD closure with plans to use an Amplatzer cribriform atrial septal occluder.  The patient appears to have 3 distinct atrial septal defects with an incomplete membrane in the left atrium.  We discussed the fact that she may require more than 1 device.  We also discussed the possibility of incomplete closure.  I think the probability of the successful closure remains high with use of a cribriform septal occluder.  I reviewed the procedural risks, indications, and alternatives at length with the patient.  She understands the risks include vascular injury, device embolization, late device erosion, stroke, myocardial infarction, cardiac injury with hemopericardium and cardiac tamponade, emergency surgery, and death.  She understands these risks occur at very low frequency.  She would like to proceed with scheduling transcatheter ASD closure.  We will schedule at the next available time.  I would like her to start clopidogrel prior to the procedure.

## 2018-03-15 ENCOUNTER — Other Ambulatory Visit: Payer: Self-pay

## 2018-03-15 MED ORDER — CLOPIDOGREL BISULFATE 75 MG PO TABS: 75.0000 mg | ORAL_TABLET | Freq: Every day | ORAL | 0 refills | Status: DC

## 2018-03-15 MED FILL — CLOPIDOGREL 75 MG TABLET: 75 | 90 days supply | Qty: 90 | Fill #0

## 2018-03-15 NOTE — Progress Notes (Addendum)
Scheduled patient for ASD closure 12/27. She will come for appointment with [facility] [place], [place] 12/19 to update H&P and get labs drawn. Instruction letter released to MyChart and Plavix 75 mg daily called in for patient to start 12/22. She was grateful for call and agrees with treatment plan.   Jodell Cipro notified.

## 2018-03-22 NOTE — Progress Notes (Signed)
[place]                                       Cardiology Office Note    Date:  03/23/2018   ID:  Erin Rivas, DOB Mar 08, 1961, MRN [id number]  PCP:  Donald Prose, MD  Cardiologist: Dr. Burt Knack   CC: discuss upcoming ASD closure.   History of Present Illness:  Erin Rivas is a 57 y.o. female with a history of HLD, hypothyroidism, obesity, ASD with right ventricular and biatrial enlargement who presents to clinic to discuss ASD closure.   She states that about 4 years ago she was noted to have a heart murmur on examination and then underwent an extensive cardiac evaluation. This included a CTA of the heart and a transesophageal echocardiogram. The latter demonstrated multiple tiny secundum atrial septal defects. Her right heart was not enlarged and she was not experiencing any symptoms and so no intervention was performed. Over the last year or so she noted progressive exercise intolerance as well as exertional dyspnea. She was recently seen and referred for a transthoracic echo that suggested right ventricular dilitation as well as moderate right atrial and severe left atrial enlargement. She was then sent for a cardiac MRI which showed mild RV dilitation, biatrial enlargement and a Qp Qs of ~1.4:1. The interatrial septum was not well seen. She was referred to Dr. Burt Knack and was sent for a repeat transesophageal echo which showed a small secundum ASD in the mid-septum as well as an aneurysmal deformation of the inferoposterior septum with at least 2 or 3 smaller defects in this region. She was then referred for a heart catheterization which demonstrated a QP:QS of 1.3:1 and normal right heart pressures. She was also sent for a repeat TEE, which again showed a multifenestrated ASD with "at least 3 separate defects in the mid and inferior septum near the IVC." Pulmonary venous drainage was noted to be normal. Dr. Burt Knack reviewed her  case and wanted further opinion about whether an intervention, surgery or continued medical observation was the best clinical option. She was seen by Dr. Corine Shelter on 01/02/18 for second opinion. He felt that the middle defect is amenable to closure. It was felt that a single device could be tried, which would close the largest defect and the outer edges would be able to sandwich the outside lesions. It is very likely that a small residual shunt could be present afterwards, but in the absence of a history of paradoxical embolism, this is of less importance. It almost certainly would prevent worsening RV size and function.   Plan was to proceed with ASD closure and be seen back in the [facility] in ~ 6 months for follow-up. Her insuarnce did not cover the procedure at [facility] so she decided to get it done at [facility] with Dr. Burt Knack. ASD closure has been set up for 03/31/18.  Today she presents to clinic for follow up. She is feeling well. Still has some dyspnea with exertion and some mild fleeting chest pains that she thinks are related to anxiety. No LE edema, orthopnea or PND. No dizziness or syncope. No blood in stool or urine. No palpitations.    Past Medical History:  Diagnosis Date  . ASD (atrial septal defect)    Small  . Hx of migraines  Would not occur without exertion  . Hyperlipidemia   . Hypothyroidism   . Obesity   . Sinus bradycardia   . Trace mitral regurgitation by prior echocardiogram 01/31/2006  . Trace tricuspid regurgitation by prior echocardiogram 01/31/2006    Past Surgical History:  Procedure Laterality Date  . DOPPLER ECHOCARDIOGRAPHY  01/31/2006  . ELECTROCARDIOGRAM  05/2008  . RIGHT HEART CATH N/A 12/16/2017   Procedure: RIGHT HEART CATH;  Surgeon: Belva Crome, MD;  Location: [place] [facility];  Service: Cardiovascular;  Laterality: N/A;  . TEE WITHOUT CARDIOVERSION N/A 11/28/2013   Procedure: TRANSESOPHAGEAL ECHOCARDIOGRAM (TEE);  Surgeon:  Dorothy Spark, MD;  Location: [facility];  Service: Cardiovascular;  Laterality: N/A;  . TEE WITHOUT CARDIOVERSION N/A 12/16/2017   Procedure: TRANSESOPHAGEAL ECHOCARDIOGRAM (TEE);  Surgeon: Larey Dresser, MD;  Location: [facility] ENDOSCOPY;  Service: Cardiovascular;  Laterality: N/A;    Current Medications: Outpatient Medications Prior to Visit  Medication Sig Dispense Refill  . aspirin 81 MG tablet Take 81 mg by mouth daily at 12 noon.     Marland Kitchen atorvastatin (LIPITOR) 20 MG tablet Take 1 tablet (20 mg total) by mouth daily. 90 tablet 3  . clopidogrel (PLAVIX) 75 MG tablet Take 1 tablet (75 mg total) by mouth daily. 95 tablet 0  . naproxen sodium (ALEVE) 220 MG tablet Take 220-440 mg by mouth 2 (two) times daily as needed (pain).    . SYNTHROID 112 MCG tablet Take 1 tablet (112 mcg total) by mouth daily. 90 tablet 3  . valACYclovir (VALTREX) 1000 MG tablet Take 500 mg by mouth daily as needed (cold sores).     No facility-administered medications prior to visit.      Allergies:   Patient has no known allergies.   Social History   Socioeconomic History  . Marital status: Married    Spouse name: Not on file  . Number of children: 0  . Years of education: Not on file  . Highest education level: Not on file  Occupational History  . Occupation: [employer]    Employer: [employer]  . Financial resource strain: Not on file  . Food insecurity:    Worry: Not on file    Inability: Not on file  . Transportation needs:    Medical: Not on file    Non-medical: Not on file  Tobacco Use  . Smoking status: Never Smoker  . Smokeless tobacco: Never Used  Substance and Sexual Activity  . Alcohol use: Yes    Comment: 1-2 per month  . Drug use: No    Types: Marijuana    Comment: quit 1981 -as a teenager  . Sexual activity: Not on file  Lifestyle  . Physical activity:    Days per week: Not on file    Minutes per session: Not on file  . Stress: Not on file    Relationships  . Social connections:    Talks on phone: Not on file    Gets together: Not on file    Attends religious service: Not on file    Active member of club or organization: Not on file    Attends meetings of clubs or organizations: Not on file    Relationship status: Not on file  Other Topics Concern  . Not on file  Social History Narrative   Married and lives at home with her husband   No children   Reasonably active and will walk to work  Family History:  The patient's family history includes COPD in her father; Mitral valve prolapse in her mother.      ROS:   Please see the history of present illness.    ROS All other systems reviewed and are negative.   PHYSICAL EXAM:   VS:  BP 122/78   Pulse 64   Ht 5\' 8"  (1.727 m)   Wt 224 lb 1.9 oz (101.7 kg)   SpO2 98%   BMI 34.08 kg/m    GEN: Well nourished, well developed, in no acute distress HEENT: normal Neck: no JVD or masses Cardiac: RRR; soft murmur. No rubs, or gallops,no edema  Respiratory:  clear to auscultation bilaterally, normal work of breathing GI: soft, nontender, nondistended, + BS MS: no deformity or atrophy Skin: warm and dry, no rash Neuro:  Alert and Oriented x 3, Strength and sensation are intact Psych: euthymic mood, full affect   Wt Readings from Last 3 Encounters:  03/23/18 224 lb 1.9 oz (101.7 kg)  12/22/17 218 lb 1.9 oz (98.9 kg)  12/16/17 218 lb 0.6 oz (98.9 kg)      Studies/Labs Reviewed:   EKG:  EKG is NOT ordered today.   Recent Labs: 11/24/2017: BUN 17; Creatinine, Ser 0.95; Hemoglobin 14.0; Platelets 299; Potassium 4.1; Sodium 139   Lipid Panel No results found for: CHOL, TRIG, HDL, CHOLHDL, VLDL, LDLCALC, LDLDIRECT  Additional studies/ records that were reviewed today include:  See HPI   ASSESSMENT & PLAN:   ASD: she has been found to have a multifenestrated ASD with "at least 3 separate defects in the mid and inferior septum near the IVC." She has been seen by  Dr. Burt Knack and Dr. Corine Shelter and it is felt that the middle defect is amenable to closure. This has been set up for 03/21/18.  We discussed consideration of transcatheter ASD closure.  I specifically reviewed the risks, indications, and alternatives.  She understands that she will likely require overnight monitoring s/p closure. She is already taking aspirin and will start taking plavix 75mg  daily on 03/26/18. She denies any history of nickel allergy.  She understands the need to follow SBE prophylaxis for a period of 6 months after closure. She understands risks associated with device closure include stroke, MI, vascular complication, arrhythmia, device embolization, late device erosion, cardiac injury with pericardial tamponade, and emergency cardiac surgery.  She understands these risks are exceedingly low.    Medication Adjustments/Labs and Tests Ordered: Current medicines are reviewed at length with the patient today.  Concerns regarding medicines are outlined above.  Medication changes, Labs and Tests ordered today are listed in the Patient Instructions below. Patient Instructions  Medication Instructions:  Your provider recommends that you continue on your current medications as directed. Please refer to the Current Medication list given to you today.    Labwork: TODAY: CBC, BMET  Testing/Procedures: No new orders   Follow-Up: You have an appointment scheduled April 27, 2018 at 3:30PM.      Signed, Angelena Form, PA-C  03/23/2018 [facility] [place], [place], [place]  [address] Phone: [phone]; Fax: [phone]

## 2018-03-22 NOTE — H&P (View-Only) (Signed)
[place]                                       Cardiology Office Note    Date:  03/23/2018   ID:  Erin Rivas, DOB 1960/09/07, MRN [id number]  PCP:  Donald Prose, MD  Cardiologist: Dr. Burt Knack   CC: discuss upcoming ASD closure.   History of Present Illness:  Erin Rivas is a 57 y.o. female with a history of HLD, hypothyroidism, obesity, ASD with right ventricular and biatrial enlargement who presents to clinic to discuss ASD closure.   She states that about 4 years ago she was noted to have a heart murmur on examination and then underwent an extensive cardiac evaluation. This included a CTA of the heart and a transesophageal echocardiogram. The latter demonstrated multiple tiny secundum atrial septal defects. Her right heart was not enlarged and she was not experiencing any symptoms and so no intervention was performed. Over the last year or so she noted progressive exercise intolerance as well as exertional dyspnea. She was recently seen and referred for a transthoracic echo that suggested right ventricular dilitation as well as moderate right atrial and severe left atrial enlargement. She was then sent for a cardiac MRI which showed mild RV dilitation, biatrial enlargement and a Qp Qs of ~1.4:1. The interatrial septum was not well seen. She was referred to Dr. Burt Knack and was sent for a repeat transesophageal echo which showed a small secundum ASD in the mid-septum as well as an aneurysmal deformation of the inferoposterior septum with at least 2 or 3 smaller defects in this region. She was then referred for a heart catheterization which demonstrated a QP:QS of 1.3:1 and normal right heart pressures. She was also sent for a repeat TEE, which again showed a multifenestrated ASD with "at least 3 separate defects in the mid and inferior septum near the IVC." Pulmonary venous drainage was noted to be normal. Dr. Burt Knack reviewed her  case and wanted further opinion about whether an intervention, surgery or continued medical observation was the best clinical option. She was seen by Dr. Corine Shelter on 01/02/18 for second opinion. He felt that the middle defect is amenable to closure. It was felt that a single device could be tried, which would close the largest defect and the outer edges would be able to sandwich the outside lesions. It is very likely that a small residual shunt could be present afterwards, but in the absence of a history of paradoxical embolism, this is of less importance. It almost certainly would prevent worsening RV size and function.   Plan was to proceed with ASD closure and be seen back in the [facility] in ~ 6 months for follow-up. Her insuarnce did not cover the procedure at [facility] so she decided to get it done at [facility] with Dr. Burt Knack. ASD closure has been set up for 03/31/18.  Today she presents to clinic for follow up. She is feeling well. Still has some dyspnea with exertion and some mild fleeting chest pains that she thinks are related to anxiety. No LE edema, orthopnea or PND. No dizziness or syncope. No blood in stool or urine. No palpitations.    Past Medical History:  Diagnosis Date  . ASD (atrial septal defect)    Small  . Hx of migraines  Would not occur without exertion  . Hyperlipidemia   . Hypothyroidism   . Obesity   . Sinus bradycardia   . Trace mitral regurgitation by prior echocardiogram 01/31/2006  . Trace tricuspid regurgitation by prior echocardiogram 01/31/2006    Past Surgical History:  Procedure Laterality Date  . DOPPLER ECHOCARDIOGRAPHY  01/31/2006  . ELECTROCARDIOGRAM  05/2008  . RIGHT HEART CATH N/A 12/16/2017   Procedure: RIGHT HEART CATH;  Surgeon: Belva Crome, MD;  Location: [place] [facility];  Service: Cardiovascular;  Laterality: N/A;  . TEE WITHOUT CARDIOVERSION N/A 11/28/2013   Procedure: TRANSESOPHAGEAL ECHOCARDIOGRAM (TEE);  Surgeon:  Dorothy Spark, MD;  Location: [place];  Service: Cardiovascular;  Laterality: N/A;  . TEE WITHOUT CARDIOVERSION N/A 12/16/2017   Procedure: TRANSESOPHAGEAL ECHOCARDIOGRAM (TEE);  Surgeon: Larey Dresser, MD;  Location: [facility] ENDOSCOPY;  Service: Cardiovascular;  Laterality: N/A;    Current Medications: Outpatient Medications Prior to Visit  Medication Sig Dispense Refill  . aspirin 81 MG tablet Take 81 mg by mouth daily at 12 noon.     Marland Kitchen atorvastatin (LIPITOR) 20 MG tablet Take 1 tablet (20 mg total) by mouth daily. 90 tablet 3  . clopidogrel (PLAVIX) 75 MG tablet Take 1 tablet (75 mg total) by mouth daily. 95 tablet 0  . naproxen sodium (ALEVE) 220 MG tablet Take 220-440 mg by mouth 2 (two) times daily as needed (pain).    . SYNTHROID 112 MCG tablet Take 1 tablet (112 mcg total) by mouth daily. 90 tablet 3  . valACYclovir (VALTREX) 1000 MG tablet Take 500 mg by mouth daily as needed (cold sores).     No facility-administered medications prior to visit.      Allergies:   Patient has no known allergies.   Social History   Socioeconomic History  . Marital status: Married    Spouse name: Not on file  . Number of children: 0  . Years of education: Not on file  . Highest education level: Not on file  Occupational History  . Occupation: [employer]    Employer: [employer]  . Financial resource strain: Not on file  . Food insecurity:    Worry: Not on file    Inability: Not on file  . Transportation needs:    Medical: Not on file    Non-medical: Not on file  Tobacco Use  . Smoking status: Never Smoker  . Smokeless tobacco: Never Used  Substance and Sexual Activity  . Alcohol use: Yes    Comment: 1-2 per month  . Drug use: No    Types: Marijuana    Comment: quit 1981 -as a teenager  . Sexual activity: Not on file  Lifestyle  . Physical activity:    Days per week: Not on file    Minutes per session: Not on file  . Stress: Not on file    Relationships  . Social connections:    Talks on phone: Not on file    Gets together: Not on file    Attends religious service: Not on file    Active member of club or organization: Not on file    Attends meetings of clubs or organizations: Not on file    Relationship status: Not on file  Other Topics Concern  . Not on file  Social History Narrative   Married and lives at home with her husband   No children   Reasonably active and will walk to work  Family History:  The patient's family history includes COPD in her father; Mitral valve prolapse in her mother.      ROS:   Please see the history of present illness.    ROS All other systems reviewed and are negative.   PHYSICAL EXAM:   VS:  BP 122/78   Pulse 64   Ht 5\' 8"  (1.727 m)   Wt 224 lb 1.9 oz (101.7 kg)   SpO2 98%   BMI 34.08 kg/m    GEN: Well nourished, well developed, in no acute distress HEENT: normal Neck: no JVD or masses Cardiac: RRR; soft murmur. No rubs, or gallops,no edema  Respiratory:  clear to auscultation bilaterally, normal work of breathing GI: soft, nontender, nondistended, + BS MS: no deformity or atrophy Skin: warm and dry, no rash Neuro:  Alert and Oriented x 3, Strength and sensation are intact Psych: euthymic mood, full affect   Wt Readings from Last 3 Encounters:  03/23/18 224 lb 1.9 oz (101.7 kg)  12/22/17 218 lb 1.9 oz (98.9 kg)  12/16/17 218 lb 0.6 oz (98.9 kg)      Studies/Labs Reviewed:   EKG:  EKG is NOT ordered today.   Recent Labs: 11/24/2017: BUN 17; Creatinine, Ser 0.95; Hemoglobin 14.0; Platelets 299; Potassium 4.1; Sodium 139   Lipid Panel No results found for: CHOL, TRIG, HDL, CHOLHDL, VLDL, LDLCALC, LDLDIRECT  Additional studies/ records that were reviewed today include:  See HPI   ASSESSMENT & PLAN:   ASD: she has been found to have a multifenestrated ASD with "at least 3 separate defects in the mid and inferior septum near the IVC." She has been seen by  Dr. Burt Knack and Dr. Corine Shelter and it is felt that the middle defect is amenable to closure. This has been set up for 03/21/18.  We discussed consideration of transcatheter ASD closure.  I specifically reviewed the risks, indications, and alternatives.  She understands that she will likely require overnight monitoring s/p closure. She is already taking aspirin and will start taking plavix 75mg  daily on 03/26/18. She denies any history of nickel allergy.  She understands the need to follow SBE prophylaxis for a period of 6 months after closure. She understands risks associated with device closure include stroke, MI, vascular complication, arrhythmia, device embolization, late device erosion, cardiac injury with pericardial tamponade, and emergency cardiac surgery.  She understands these risks are exceedingly low.    Medication Adjustments/Labs and Tests Ordered: Current medicines are reviewed at length with the patient today.  Concerns regarding medicines are outlined above.  Medication changes, Labs and Tests ordered today are listed in the Patient Instructions below. Patient Instructions  Medication Instructions:  Your provider recommends that you continue on your current medications as directed. Please refer to the Current Medication list given to you today.    Labwork: TODAY: CBC, BMET  Testing/Procedures: No new orders   Follow-Up: You have an appointment scheduled April 27, 2018 at 3:30PM.      Signed, Angelena Form, PA-C  03/23/2018 [facility] [place], [place], [place]  [address] Phone: [phone]; Fax: [phone]

## 2018-03-23 ENCOUNTER — Ambulatory Visit: Payer: 59 | Admitting: Physician Assistant

## 2018-03-23 ENCOUNTER — Encounter: Payer: Self-pay | Admitting: Physician Assistant

## 2018-03-23 VITALS — BP 122/78 | HR 64 | Ht 68.0 in | Wt 224.1 lb

## 2018-03-23 DIAGNOSIS — Q211 Atrial septal defect, unspecified: Secondary | ICD-10-CM

## 2018-03-23 DIAGNOSIS — I517 Cardiomegaly: Secondary | ICD-10-CM | POA: Diagnosis not present

## 2018-03-23 NOTE — Patient Instructions (Addendum)
Medication Instructions:  Your provider recommends that you continue on your current medications as directed. Please refer to the Current Medication list given to you today.    Labwork: TODAY: CBC, BMET  Testing/Procedures: No new orders   Follow-Up: You have an appointment scheduled April 27, 2018 at 3:30PM.

## 2018-03-24 LAB — BASIC METABOLIC PANEL
BUN / CREAT RATIO: 18 (ref 9–23)
BUN: 15 mg/dL (ref 6–24)
CHLORIDE: 104 mmol/L (ref 96–106)
CO2: 25 mmol/L (ref 20–29)
Calcium: 9.4 mg/dL (ref 8.7–10.2)
Creatinine, Ser: 0.82 mg/dL (ref 0.57–1.00)
GFR calc non Af Amer: 79 mL/min/{1.73_m2} (ref >59)
GFR, EST AFRICAN AMERICAN: 91 mL/min/{1.73_m2} (ref >59)
Glucose: 89 mg/dL (ref 65–99)
POTASSIUM: 4.6 mmol/L (ref 3.5–5.2)
Sodium: 143 mmol/L (ref 134–144)

## 2018-03-24 LAB — CBC WITH DIFFERENTIAL/PLATELET
BASOS ABS: 0.1 10*3/uL (ref 0.0–0.2)
Basos: 1 %
EOS (ABSOLUTE): 0.1 10*3/uL (ref 0.0–0.4)
Eos: 1 %
HEMOGLOBIN: 13.5 g/dL (ref 11.1–15.9)
Hematocrit: 39.8 % (ref 34.0–46.6)
Immature Grans (Abs): 0 10*3/uL (ref 0.0–0.1)
Immature Granulocytes: 0 %
LYMPHS ABS: 2.3 10*3/uL (ref 0.7–3.1)
Lymphs: 29 %
MCH: 28.9 pg (ref 26.6–33.0)
MCHC: 33.9 g/dL (ref 31.5–35.7)
MCV: 85 fL (ref 79–97)
MONOCYTES: 8 %
Monocytes Absolute: 0.7 10*3/uL (ref 0.1–0.9)
NEUTROS ABS: 4.9 10*3/uL (ref 1.4–7.0)
Neutrophils: 61 %
Platelets: 327 10*3/uL (ref 150–450)
RBC: 4.67 x10E6/uL (ref 3.77–5.28)
RDW: 13 % (ref 12.3–15.4)
WBC: 8.1 10*3/uL (ref 3.4–10.8)

## 2018-03-31 ENCOUNTER — Encounter (HOSPITAL_COMMUNITY)
Admission: RE | Disposition: A | Discharge status: Home / Self Care | Payer: Self-pay | Source: Home / Self Care | Attending: Cardiovascular Disease

## 2018-03-31 ENCOUNTER — Other Ambulatory Visit: Payer: Self-pay

## 2018-03-31 ENCOUNTER — Encounter (HOSPITAL_COMMUNITY): Payer: Self-pay | Admitting: General Practice

## 2018-03-31 ENCOUNTER — Other Ambulatory Visit (HOSPITAL_COMMUNITY): Payer: 59

## 2018-03-31 ENCOUNTER — Ambulatory Visit (HOSPITAL_COMMUNITY)
Admission: RE | Admit: 2018-03-31 | Discharge: 2018-04-01 | Disposition: A | Discharge status: Home / Self Care | Payer: 59 | Attending: Cardiovascular Disease | Admitting: Cardiovascular Disease

## 2018-03-31 DIAGNOSIS — I5030 Unspecified diastolic (congestive) heart failure: Secondary | ICD-10-CM | POA: Insufficient documentation

## 2018-03-31 DIAGNOSIS — E785 Hyperlipidemia, unspecified: Secondary | ICD-10-CM | POA: Diagnosis not present

## 2018-03-31 DIAGNOSIS — Z7902 Long term (current) use of antithrombotics/antiplatelets: Secondary | ICD-10-CM | POA: Diagnosis not present

## 2018-03-31 DIAGNOSIS — Z7982 Long term (current) use of aspirin: Secondary | ICD-10-CM | POA: Insufficient documentation

## 2018-03-31 DIAGNOSIS — Q211 Atrial septal defect, unspecified: Secondary | ICD-10-CM

## 2018-03-31 DIAGNOSIS — E039 Hypothyroidism, unspecified: Secondary | ICD-10-CM | POA: Diagnosis not present

## 2018-03-31 DIAGNOSIS — Z6834 Body mass index (BMI) 34.0-34.9, adult: Secondary | ICD-10-CM | POA: Diagnosis not present

## 2018-03-31 DIAGNOSIS — Z955 Presence of coronary angioplasty implant and graft: Secondary | ICD-10-CM | POA: Diagnosis not present

## 2018-03-31 DIAGNOSIS — I083 Combined rheumatic disorders of mitral, aortic and tricuspid valves: Secondary | ICD-10-CM | POA: Insufficient documentation

## 2018-03-31 DIAGNOSIS — Z79899 Other long term (current) drug therapy: Secondary | ICD-10-CM | POA: Diagnosis not present

## 2018-03-31 DIAGNOSIS — E669 Obesity, unspecified: Secondary | ICD-10-CM | POA: Diagnosis not present

## 2018-03-31 DIAGNOSIS — Z79890 Hormone replacement therapy (postmenopausal): Secondary | ICD-10-CM | POA: Insufficient documentation

## 2018-03-31 HISTORY — DX: Cardiac murmur, unspecified: R01.1

## 2018-03-31 HISTORY — PX: ATRIAL SEPTAL DEFECT(ASD) CLOSURE: CATH118299

## 2018-03-31 HISTORY — DX: Migraine, unspecified, not intractable, without status migrainosus: G43.909

## 2018-03-31 HISTORY — PX: ASD REPAIR: SHX258

## 2018-03-31 LAB — POCT ACTIVATED CLOTTING TIME
Activated Clotting Time: 213 seconds
Activated Clotting Time: 235 seconds
Activated Clotting Time: 235 seconds

## 2018-03-31 SURGERY — ATRIAL SEPTAL DEFECT (ASD) CLOSURE
Anesthesia: LOCAL

## 2018-03-31 MED ORDER — SODIUM CHLORIDE 0.9 % IV SOLN: 250.0000 mL | INTRAVENOUS | Status: DC | PRN

## 2018-03-31 MED ORDER — SODIUM CHLORIDE 0.9% FLUSH: 3.0000 mL | INTRAVENOUS | Status: DC | PRN

## 2018-03-31 MED ORDER — SODIUM CHLORIDE 0.9 % WEIGHT BASED INFUSION: 1.0000 mL/kg/h | INTRAVENOUS | Status: DC

## 2018-03-31 MED ORDER — ATORVASTATIN CALCIUM 10 MG PO TABS
20.0000 mg | ORAL_TABLET | Freq: Every day | ORAL | Status: DC
  Administered 2018-03-31 – 2018-04-01 (×2): 20 mg via ORAL
  Filled 2018-03-31 (×2): qty 2

## 2018-03-31 MED ORDER — FENTANYL CITRATE (PF) 100 MCG/2ML IJ SOLN
INTRAMUSCULAR | Status: DC | PRN
  Administered 2018-03-31 (×3): 25 ug via INTRAVENOUS

## 2018-03-31 MED ORDER — DIAZEPAM 5 MG PO TABS: 5.0000 mg | ORAL_TABLET | ORAL | Status: DC | PRN

## 2018-03-31 MED ORDER — CEFAZOLIN SODIUM-DEXTROSE 2-4 GM/100ML-% IV SOLN
INTRAVENOUS | Status: AC
  Filled 2018-03-31: qty 100

## 2018-03-31 MED ORDER — SODIUM CHLORIDE 0.9% FLUSH: 3.0000 mL | Freq: Two times a day (BID) | INTRAVENOUS | Status: DC

## 2018-03-31 MED ORDER — MIDAZOLAM HCL 2 MG/2ML IJ SOLN
INTRAMUSCULAR | Status: AC
  Filled 2018-03-31: qty 2

## 2018-03-31 MED ORDER — ACETAMINOPHEN 325 MG PO TABS
650.0000 mg | ORAL_TABLET | ORAL | Status: DC | PRN
  Administered 2018-04-01: 650 mg via ORAL
  Filled 2018-03-31: qty 2

## 2018-03-31 MED ORDER — HEPARIN (PORCINE) IN NACL 1000-0.9 UT/500ML-% IV SOLN
INTRAVENOUS | Status: AC
  Filled 2018-03-31: qty 500

## 2018-03-31 MED ORDER — HEPARIN (PORCINE) IN NACL 1000-0.9 UT/500ML-% IV SOLN
INTRAVENOUS | Status: DC | PRN
  Administered 2018-03-31 (×3): 500 mL

## 2018-03-31 MED ORDER — HEPARIN SODIUM (PORCINE) 1000 UNIT/ML IJ SOLN
INTRAMUSCULAR | Status: AC
  Filled 2018-03-31: qty 1

## 2018-03-31 MED ORDER — SODIUM CHLORIDE 0.9 % WEIGHT BASED INFUSION
3.0000 mL/kg/h | INTRAVENOUS | Status: DC
  Administered 2018-03-31: 3 mL/kg/h via INTRAVENOUS

## 2018-03-31 MED ORDER — FENTANYL CITRATE (PF) 100 MCG/2ML IJ SOLN
INTRAMUSCULAR | Status: AC
  Filled 2018-03-31: qty 2

## 2018-03-31 MED ORDER — HEPARIN (PORCINE) IN NACL 1000-0.9 UT/500ML-% IV SOLN
INTRAVENOUS | Status: AC
  Filled 2018-03-31: qty 1000

## 2018-03-31 MED ORDER — CEFAZOLIN SODIUM-DEXTROSE 2-4 GM/100ML-% IV SOLN
2.0000 g | INTRAVENOUS | Status: AC
  Administered 2018-03-31: 2 g via INTRAVENOUS

## 2018-03-31 MED ORDER — CLOPIDOGREL BISULFATE 75 MG PO TABS: 75.0000 mg | ORAL_TABLET | ORAL | Status: DC

## 2018-03-31 MED ORDER — LIDOCAINE HCL (PF) 1 % IJ SOLN
INTRAMUSCULAR | Status: DC | PRN
  Administered 2018-03-31: 12 mL

## 2018-03-31 MED ORDER — OXYCODONE HCL 5 MG PO TABS: 5.0000 mg | ORAL_TABLET | ORAL | Status: DC | PRN

## 2018-03-31 MED ORDER — MIDAZOLAM HCL 2 MG/2ML IJ SOLN
INTRAMUSCULAR | Status: DC | PRN
  Administered 2018-03-31: 2 mg via INTRAVENOUS
  Administered 2018-03-31: 1 mg via INTRAVENOUS
  Administered 2018-03-31: 2 mg via INTRAVENOUS

## 2018-03-31 MED ORDER — HEPARIN SODIUM (PORCINE) 1000 UNIT/ML IJ SOLN
INTRAMUSCULAR | Status: DC | PRN
  Administered 2018-03-31: 3000 [IU] via INTRAVENOUS
  Administered 2018-03-31: 2000 [IU] via INTRAVENOUS
  Administered 2018-03-31: 8000 [IU] via INTRAVENOUS

## 2018-03-31 MED ORDER — LEVOTHYROXINE SODIUM 112 MCG PO TABS
112.0000 ug | ORAL_TABLET | Freq: Every day | ORAL | Status: DC
  Administered 2018-04-01: 112 ug via ORAL
  Filled 2018-03-31: qty 1

## 2018-03-31 MED ORDER — ASPIRIN 81 MG PO CHEW: 81.0000 mg | CHEWABLE_TABLET | ORAL | Status: DC

## 2018-03-31 MED ORDER — SODIUM CHLORIDE 0.9 % IV SOLN: INTRAVENOUS | Status: AC

## 2018-03-31 MED ORDER — ASPIRIN EC 81 MG PO TBEC
81.0000 mg | DELAYED_RELEASE_TABLET | Freq: Every day | ORAL | Status: DC
  Administered 2018-04-01: 12:00:00 81 mg via ORAL
  Filled 2018-03-31: qty 1

## 2018-03-31 MED ORDER — CLOPIDOGREL BISULFATE 75 MG PO TABS
75.0000 mg | ORAL_TABLET | Freq: Every day | ORAL | Status: DC
  Administered 2018-04-01: 10:00:00 75 mg via ORAL
  Filled 2018-03-31: qty 1

## 2018-03-31 MED ORDER — LIDOCAINE HCL (PF) 1 % IJ SOLN
INTRAMUSCULAR | Status: AC
  Filled 2018-03-31: qty 30

## 2018-03-31 MED ORDER — ONDANSETRON HCL 4 MG/2ML IJ SOLN: 4.0000 mg | Freq: Four times a day (QID) | INTRAMUSCULAR | Status: DC | PRN

## 2018-03-31 SURGICAL SUPPLY — 22 items
BALLN SIZING AMPLATZER 24 (BALLOONS) ×2
BALLOON SIZING AMPLATZER 24 (BALLOONS) ×1 IMPLANT
BALN SZ 70X4.5 7FR 3 LUM (BALLOONS) ×1
CATH ACUNAV 8FR 90CM (CATHETERS) ×2 IMPLANT
CATH SUPER TORQUE PLUS 6F MPA1 (CATHETERS) ×2 IMPLANT
COVER SWIFTLINK CONNECTOR (BAG) ×2 IMPLANT
GUIDEWIRE AMPLATZER 1.5JX260 (WIRE) ×2 IMPLANT
GUIDEWIRE ANGLED .035X260CM (WIRE) ×2 IMPLANT
GUIDEWIRE SAFE TJ AMPLATZ EXST (WIRE) ×2 IMPLANT
OCCLUDER AMPLATZER PFO 25MM (Prosthesis & Implant Heart) ×2 IMPLANT
OCCLUDER CRIBRIFORM 18MM (Prosthesis & Implant Heart) ×2 IMPLANT
PACK CARDIAC CATHETERIZATION (CUSTOM PROCEDURE TRAY) ×2 IMPLANT
PROTECTION STATION PRESSURIZED (MISCELLANEOUS) ×2
SHEATH INTROD W/O MIN 9FR 25CM (SHEATH) ×2 IMPLANT
SHEATH PINNACLE 6F 10CM (SHEATH) ×2 IMPLANT
SHEATH PINNACLE 8F 10CM (SHEATH) ×2 IMPLANT
SHEATH PINNACLE 9F 10CM (SHEATH) ×2 IMPLANT
SHEATH PROBE COVER 6X72 (BAG) ×2 IMPLANT
STATION PROTECTION PRESSURIZED (MISCELLANEOUS) ×1 IMPLANT
SYS DELIVER AMP TREVISIO 8FR (SHEATH) ×4
SYSTEM DELIVER AMP TREVIS 8FR (SHEATH) ×2 IMPLANT
WIRE EMERALD 3MM-J .035X150CM (WIRE) ×2 IMPLANT

## 2018-03-31 NOTE — CV Procedure (Signed)
6 Fr, 8 Fr and 9 Fr F/V sheats were pulled, and manual hod was applied initiallyy for 30 min., than another 30 min because of a persistent ooze, The groin was soft and non tender, and the patient was given instructions for bed rest. Bed rest started at 1430. HR - Sb 53 BP 147/82 SpO2 - 98 % RA.

## 2018-03-31 NOTE — Interval H&P Note (Signed)
History and Physical Interval Note:  03/31/2018 11:11 AM  Erin Rivas  has presented today for surgery, with the diagnosis of ASD  The various methods of treatment have been discussed with the patient and family. After consideration of risks, benefits and other options for treatment, the patient has consented to  Procedure(s): ATRIAL SEPTAL DEFECT (ASD) CLOSURE (N/A) as a surgical intervention .  The patient's history has been reviewed, patient examined, no change in status, stable for surgery.  I have reviewed the patient's chart and labs.  Questions were answered to the patient's satisfaction.     Sherren Mocha

## 2018-03-31 NOTE — Progress Notes (Signed)
Post-Procedure note:  Pt doing well. Mild headache otherwise no complaints. Rhythm/BP stable.   Plan: limited echo tomorrow am. DC home after echo study completed.  Should take ASA 81 mg long term and plavix 75 mg x 3 months.   Sherren Mocha 03/31/2018 5:18 PM

## 2018-04-01 ENCOUNTER — Other Ambulatory Visit: Payer: Self-pay | Admitting: Nurse Practitioner

## 2018-04-01 ENCOUNTER — Encounter (HOSPITAL_COMMUNITY): Payer: Self-pay | Admitting: Nurse Practitioner

## 2018-04-01 ENCOUNTER — Inpatient Hospital Stay (HOSPITAL_BASED_OUTPATIENT_CLINIC_OR_DEPARTMENT_OTHER): Payer: 59

## 2018-04-01 DIAGNOSIS — Z7902 Long term (current) use of antithrombotics/antiplatelets: Secondary | ICD-10-CM | POA: Diagnosis not present

## 2018-04-01 DIAGNOSIS — E039 Hypothyroidism, unspecified: Secondary | ICD-10-CM | POA: Diagnosis not present

## 2018-04-01 DIAGNOSIS — Q211 Atrial septal defect, unspecified: Secondary | ICD-10-CM

## 2018-04-01 DIAGNOSIS — I361 Nonrheumatic tricuspid (valve) insufficiency: Secondary | ICD-10-CM

## 2018-04-01 DIAGNOSIS — I083 Combined rheumatic disorders of mitral, aortic and tricuspid valves: Secondary | ICD-10-CM | POA: Diagnosis not present

## 2018-04-01 DIAGNOSIS — Z79899 Other long term (current) drug therapy: Secondary | ICD-10-CM | POA: Diagnosis not present

## 2018-04-01 DIAGNOSIS — E785 Hyperlipidemia, unspecified: Secondary | ICD-10-CM | POA: Diagnosis not present

## 2018-04-01 DIAGNOSIS — I5030 Unspecified diastolic (congestive) heart failure: Secondary | ICD-10-CM | POA: Diagnosis not present

## 2018-04-01 DIAGNOSIS — E669 Obesity, unspecified: Secondary | ICD-10-CM | POA: Diagnosis not present

## 2018-04-01 DIAGNOSIS — Z7982 Long term (current) use of aspirin: Secondary | ICD-10-CM | POA: Diagnosis not present

## 2018-04-01 MED ORDER — CLOPIDOGREL BISULFATE 75 MG PO TABS: 75.0000 mg | ORAL_TABLET | Freq: Every day | ORAL | 0 refills | Status: AC

## 2018-04-01 MED ORDER — AMOXICILLIN 500 MG PO TABS: 2000.0000 mg | ORAL_TABLET | Freq: Once | ORAL | 3 refills | Status: DC | PRN

## 2018-04-01 NOTE — Progress Notes (Signed)
  Echocardiogram 2D Echocardiogram limited S/P PFO closure has been performed.  Erin Rivas M 04/01/2018, 8:17 AM

## 2018-04-01 NOTE — Discharge Summary (Signed)
Discharge Summary    Patient ID: Erin Rivas MRN: [id number]; DOB: 13-May-1960  Admit date: 03/31/2018 Discharge date: 04/01/2018  Primary Care Provider: Donald Prose, MD  Primary Cardiologist: Sherren Mocha, MD   Discharge Diagnoses    Active Problems:   ASD (atrial septal defect)   Allergies No Known Allergies  Diagnostic Studies/Procedures    ASD Closure 12.27.2019  Successful transcatheter closure of a complex secundum atrial septal defect secondary to multiple fenestrations.  The defects are closed with an 18 mm Amplatzer cribriform device and a 25 mm Amplatzer PFO occluder   The patient will be monitored overnight with a limited echocardiogram in the morning, anticipate hospital discharge tomorrow morning.  She should take aspirin and clopidogrel for 6 months and follow SBE prophylaxis when indicated x6 months. _____________   History of Present Illness     57 year old female with a history of cardiac murmur with subsequent finding of multiple small secundum atrial septal defects.  Over the past year, she has had progressive exercise intolerance as well as exertional dyspnea.  She was evaluated with echocardiogram more recently which showed right ventricular dilatation as well as moderate right atrial and severe left atrial enlargement.  Cardiac MRI showed mild RV dilatation, biatrial enlargement, and a Qp:Qs of approximately 1.4: 1.  She was seen by Dr. 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  Right heart catheterization was performed and showed a peak QP:QS of 1.3:1 and normal right heart pressures.  TEE was performed and showed a multi-fenestrated ASD with at least 3 separate defects in the mid inferior septum near the IVC.  She subsequently followed up with the [facility]  with a plan for ASD closure but because insurance did not cover the procedure at [facility], she returned to our structural heart clinic to pursue the procedure.  Hospital Course     Consultants: None  Patient presented to the [facility] cardiac catheterization laboratory on December 27, and underwent successful transcatheter closure of a complex secundum atrial septal defect secondary to multiple fenestrations.  Post procedure she reported a mild headache but was otherwise stable.  This morning, she feels well without chest pain or dyspnea.  She has been ambulating without difficulty.  Follow-up echocardiogram this AM has shown no obvious shunt.  She will be discharged home today in good condition.  She will remain on aspirin aspirin indefinitely and Plavix for 3 months.  She will also require SBE prophylaxis when indicated x6 months. We will arrange for a follow-up echo with bubble study prior to her return office visit. _____________  Discharge Vitals Blood pressure 116/77, pulse (!) 58, temperature 98.8 F (37.1 C), temperature source Oral, resp. rate 17, height 5\' 8"  (1.727 m), weight 99.8 kg, SpO2 96 %.  Filed Weights   03/31/18 0937  Weight: 99.8 kg    Labs & Radiologic Studies    None   Disposition   Pt is being discharged home today in good condition.  Follow-up Plans & Appointments    Follow-up Information    Eileen Stanford, PA-C Follow up on 04/27/2018.   Specialties:  Cardiology, Radiology Why:  3:30 PM Contact information: [place] [phone]           Discharge Medications   Allergies as of  04/01/2018   No Known Allergies     Medication List    STOP taking these medications   naproxen sodium 220 MG tablet Commonly known as:  ALEVE     TAKE these medications   amoxicillin 500 MG tablet Commonly known as:  AMOXIL Take 4 tablets (2,000 mg total) by mouth once as needed for up to 1 dose (to be taken when  indicated before procedures).   aspirin 81 MG tablet Take 81 mg by mouth daily at 12 noon.   atorvastatin 20 MG tablet Commonly known as:  LIPITOR Take 1 tablet (20 mg total) by mouth daily.   clopidogrel 75 MG tablet Commonly known as:  PLAVIX Take 1 tablet (75 mg total) by mouth daily.   SYNTHROID 112 MCG tablet Generic drug:  levothyroxine Take 1 tablet (112 mcg total) by mouth daily.   valACYclovir 1000 MG tablet Commonly known as:  VALTREX Take 500 mg by mouth daily as needed (cold sores).          Outstanding Labs/Studies   None  Duration of Discharge Encounter   Greater than 30 minutes including physician time.  Signed, Murray Hodgkins, NP 04/01/2018, 10:49 AM

## 2018-04-01 NOTE — Progress Notes (Signed)
   Progress Note  Patient Name: Erin Rivas Date of Encounter: 04/01/2018  Primary Cardiologist: Sherren Mocha, MD   Subjective   Denies any chest pain or SOB.  S/P ASD closure device yesterday  Inpatient Medications    Scheduled Meds: . aspirin EC  81 mg Oral Q1200  . atorvastatin  20 mg Oral Daily  . clopidogrel  75 mg Oral Daily  . levothyroxine  112 mcg Oral QAC breakfast  . sodium chloride flush  3 mL Intravenous Q12H   Continuous Infusions: . sodium chloride     PRN Meds: sodium chloride, acetaminophen, diazepam, ondansetron (ZOFRAN) IV, oxyCODONE, sodium chloride flush   Vital Signs    Vitals:   03/31/18 1918 03/31/18 2000 04/01/18 0534 04/01/18 0700  BP: 123/83  (!) 108/59 116/77  Pulse: 61 68 66 (!) 59  Resp: 10 19 (!) 22 11  Temp: 99.2 F (37.3 C)  99.2 F (37.3 C) 98.8 F (37.1 C)  TempSrc: Oral  Oral Oral  SpO2: 99% 99% 96% 96%  Weight:      Height:        Intake/Output Summary (Last 24 hours) at 04/01/2018 0825 Last data filed at 04/01/2018 0600 Gross per 24 hour  Intake 887.15 ml  Output 2400 ml  Net -1512.85 ml   Filed Weights   03/31/18 0937  Weight: 99.8 kg    Telemetry    NSR - Personally Reviewed  ECG    No new EKG to review - Personally Reviewed  Physical Exam   GEN: No acute distress.   Neck: No JVD Cardiac: RRR, no murmurs, rubs, or gallops.  Respiratory: Clear to auscultation bilaterally. GI: Soft, nontender, non-distended  MS: No edema; No deformity. Neuro:  Nonfocal  Psych: Normal affect   Labs    ChemistryNo results for input(s): NA, K, CL, CO2, GLUCOSE, BUN, CREATININE, CALCIUM, PROT, ALBUMIN, AST, ALT, ALKPHOS, BILITOT, GFRNONAA, GFRAA, ANIONGAP in the last 168 hours.   HematologyNo results for input(s): WBC, RBC, HGB, HCT, MCV, MCH, MCHC, RDW, PLT in the last 168 hours.  Cardiac EnzymesNo results for input(s): TROPONINI in the last 168 hours. No results for input(s): TROPIPOC in the last 168 hours.    BNPNo results for input(s): BNP, PROBNP in the last 168 hours.   DDimer No results for input(s): DDIMER in the last 168 hours.   Radiology    No results found.  Cardiac Studies   Echo pending  Patient Profile     57 y.o. female with a history of HLD, hypothyroidism, obesity, ASD with right ventricular and biatrial enlargement who presents to clinic to discuss ASD closure.   Assessment & Plan    1.  ASD - s/p closure -doing well with no complaints -2D echo pending -stable for discharge if echo is ok  For questions or updates, please contact [place] Please consult [email or web address] for contact info under Cardiology/STEMI.      Signed, Fransico Him, MD  04/01/2018, 8:25 AM

## 2018-04-01 NOTE — Discharge Instructions (Signed)

## 2018-04-02 LAB — ECHOCARDIOGRAM LIMITED
Height: 68 in
WEIGHTICAEL: 3520 [oz_av]

## 2018-04-03 ENCOUNTER — Encounter (HOSPITAL_COMMUNITY): Payer: Self-pay | Admitting: Cardiovascular Disease

## 2018-04-04 ENCOUNTER — Telehealth: Payer: Self-pay | Admitting: Cardiovascular Disease

## 2018-04-04 NOTE — Telephone Encounter (Signed)
New Message     Patient states she's having stabbing pain on her left side after having a ASD Closure surgery performed by Dr. Burt Knack.  Also through the night she experienced a little of SOB and she is currently at work now wanting to speak to the nurse about the issue.   (NO dot phrase found)

## 2018-04-04 NOTE — Telephone Encounter (Signed)
Left message to call back  

## 2018-04-04 NOTE — Telephone Encounter (Signed)
Spoke with the patient, she has been experiencing a sharp stabbing pain on her left side and can radiate down her leg. She stated this pain only occurs during periods of relaxation only, never during activity. It started yesterday and went away last night. She sat down in bed and used a heating pack on the site and the pain resolved. She had recent ASD closure on 12/27. This morning she has not had any problems, she is slightly sore but nothing else remarkable. Spoke with nurses in triage and Dr. Curt Bears (DOD), she could be experiencing post-op pain and she should just monitor. Advised her if symptoms worsen to let our office know or go to hospital if chest pain worsens.

## 2018-04-04 NOTE — Telephone Encounter (Signed)
I recommend with monitoring of symptoms; hopefully they will improve with time.  I am not sure why ASD closure would precipitate this sort of pain.  She should let us know if it worsens or if any focal neurologic deficits develop.

## 2018-04-10 NOTE — Telephone Encounter (Signed)
Called to check on patient. She states she feels much better and is not experiencing any more pain.  She was grateful for follow-up.

## 2018-04-13 ENCOUNTER — Telehealth: Payer: Self-pay

## 2018-04-13 DIAGNOSIS — I517 Cardiomegaly: Secondary | ICD-10-CM

## 2018-04-13 DIAGNOSIS — Q211 Atrial septal defect, unspecified: Secondary | ICD-10-CM

## 2018-04-13 DIAGNOSIS — I421 Obstructive hypertrophic cardiomyopathy: Secondary | ICD-10-CM

## 2018-04-13 DIAGNOSIS — Q2116 Sinus venosus atrial septal defect, unspecified: Secondary | ICD-10-CM

## 2018-04-13 NOTE — Telephone Encounter (Signed)
Bubble study cancelled. Echo scheduled same day as KT OV. Ms. Rhoads was grateful for assistance.

## 2018-04-13 NOTE — Telephone Encounter (Signed)
-----   Message from Eileen Stanford, PA-C sent at 04/11/2018  7:53 AM EST ----- Regarding: RE: bubble study? Coop said to just keep this bc it was a complex case. We do not need a bubble study but can keep a complete echo. Thank you so much for looking into it! ----- Message ----- From: Theodoro Parma, RN Sent: 04/07/2018   5:26 PM EST To: Eileen Stanford, PA-C Subject: bubble study?                                  Does this patient need her bubble study scheduled 1/13? Ignacia Bayley ordered it (it looks like) when she was in the hospital. Thanks!

## 2018-04-17 ENCOUNTER — Other Ambulatory Visit (HOSPITAL_COMMUNITY): Payer: 59

## 2018-04-26 ENCOUNTER — Other Ambulatory Visit (HOSPITAL_COMMUNITY)
Admission: RE | Admit: 2018-04-26 | Discharge: 2018-04-26 | Disposition: A | Discharge status: Home / Self Care | Payer: 59 | Source: Ambulatory Visit | Attending: Family Medicine | Admitting: Family Medicine

## 2018-04-26 ENCOUNTER — Other Ambulatory Visit: Payer: Self-pay | Admitting: Family Medicine

## 2018-04-26 DIAGNOSIS — Z0000 Encounter for general adult medical examination without abnormal findings: Secondary | ICD-10-CM | POA: Diagnosis not present

## 2018-04-26 DIAGNOSIS — E7800 Pure hypercholesterolemia, unspecified: Secondary | ICD-10-CM | POA: Diagnosis not present

## 2018-04-26 DIAGNOSIS — Z1231 Encounter for screening mammogram for malignant neoplasm of breast: Secondary | ICD-10-CM | POA: Diagnosis not present

## 2018-04-26 DIAGNOSIS — E039 Hypothyroidism, unspecified: Secondary | ICD-10-CM | POA: Diagnosis not present

## 2018-04-26 MED FILL — SYNTHROID 112 MCG TABLET: 112 | 90 days supply | Qty: 90 | Fill #0

## 2018-04-26 MED FILL — ATORVASTATIN CALCIUM 20 MG: 20 | 90 days supply | Qty: 90 | Fill #0

## 2018-04-27 ENCOUNTER — Ambulatory Visit (INDEPENDENT_AMBULATORY_CARE_PROVIDER_SITE_OTHER): Payer: 59 | Admitting: Physician Assistant

## 2018-04-27 ENCOUNTER — Other Ambulatory Visit: Payer: Self-pay | Admitting: Physician Assistant

## 2018-04-27 ENCOUNTER — Encounter: Payer: Self-pay | Admitting: Physician Assistant

## 2018-04-27 ENCOUNTER — Ambulatory Visit (HOSPITAL_COMMUNITY): Payer: 59 | Attending: Cardiovascular Disease

## 2018-04-27 ENCOUNTER — Other Ambulatory Visit: Payer: Self-pay

## 2018-04-27 VITALS — BP 116/74 | HR 75 | Ht 68.0 in | Wt 222.4 lb

## 2018-04-27 DIAGNOSIS — Q211 Atrial septal defect: Secondary | ICD-10-CM | POA: Diagnosis not present

## 2018-04-27 DIAGNOSIS — Z8774 Personal history of (corrected) congenital malformations of heart and circulatory system: Secondary | ICD-10-CM

## 2018-04-27 DIAGNOSIS — Q2116 Sinus venosus atrial septal defect, unspecified: Secondary | ICD-10-CM

## 2018-04-27 DIAGNOSIS — I517 Cardiomegaly: Secondary | ICD-10-CM | POA: Insufficient documentation

## 2018-04-27 DIAGNOSIS — I421 Obstructive hypertrophic cardiomyopathy: Secondary | ICD-10-CM | POA: Insufficient documentation

## 2018-04-27 LAB — ECHOCARDIOGRAM COMPLETE
Height: 68 in
Weight: 3558.4 oz

## 2018-04-27 MED ORDER — AMOXICILLIN 500 MG PO TABS: 2000.0000 mg | ORAL_TABLET | Freq: Once | ORAL | 3 refills | Status: DC | PRN

## 2018-04-27 MED FILL — AMOXICILLIN 500 MG CAPSULE: 500 | 3 days supply | Qty: 12 | Fill #0

## 2018-04-27 NOTE — Progress Notes (Signed)
Erin Rivas                                       Cardiology Office Note    Date:  04/27/2018   ID:  Erin Rivas, DOB 1960-12-15, MRN [id number]  PCP:  Donald Prose, MD  Cardiologist: Dr. Burt Knack   CC: 1 month s/p  ASD closure.   History of Present Illness:  Erin Rivas is a 58 y.o. female with a history of HLD, hypothyroidism, obesity, ASD with right ventricular and biatrial enlargement who presents to clinic to discuss ASD closure.   She states that about 4 years ago she was noted to have a heart murmur on examination and then underwent an extensive cardiac evaluation. This included a CTA of the heart and a transesophageal echocardiogram. The latter demonstrated multiple tiny secundum atrial septal defects. Her right heart was not enlarged and she was not experiencing any symptoms and so no intervention was performed. Over the last year or so she noted progressive exercise intolerance as well as exertional dyspnea. She was recently seen and referred for a transthoracic echo that suggested right ventricular dilitation as well as moderate right atrial and severe left atrial enlargement. She was then sent for a cardiac MRI which showed mild RV dilitation, biatrial enlargement and a Qp Qs of ~1.4:1. The interatrial septum was not well seen. She was referred to Dr. Burt Knack and was sent for a repeat transesophageal echo which showed a small secundum ASD in the mid-septum as well as an aneurysmal deformation of the inferoposterior septum with at least 2 or 3 smaller defects in this region. She was then referred for a heart catheterization which demonstrated a QP:QS of 1.3:1 and normal right heart pressures. She was also sent for a repeat TEE, which again showed a multifenestrated ASD with "at least 3 separate defects in the mid and inferior septum near the IVC." Pulmonary venous drainage was noted to be normal. Dr. Burt Knack reviewed her case and  wanted further opinion about whether an intervention, surgery or continued medical observation was the best clinical option. She was seen by Dr. Corine Shelter on 01/02/18 for second opinion. He felt that the middle defect is amenable to closure. It was felt that a single device could be tried, which would close the largest defect and the outer edges would be able to sandwich the outside lesions. It is very likely that a small residual shunt could be present afterwards, but in the absence of a history of paradoxical embolism, this is of less importance. It almost certainly would prevent worsening RV size and function.   Plan was to proceed with ASD closure and be seen back in the [facility] in ~ 6 months for follow-up. Her insuarnce did not cover the procedure at [facility] so she decided to get it done at [facility] with Dr. Burt Knack. ASD closure has been set up for 03/31/18.  She underwent successful transcatheter closure of a complex secundum atrial septal defect secondary to multiple fenestrations. Follow-up echocardiogram showed no obvious shunt. She was discharged on Aspirin and Plavix for 3 months followed by aspirin indefinitely.   Today she presents to clinic for follow up. She has been doing well. A couple days after the procedure she had some sharp chest pains that have resolved. She can tell a difference in her  shortness of breath. No LE edema, orthopnea or PND. No dizziness or syncope. No blood in stool or urine. No palpitations. She actually feels like her pulse is more steady.   Past Medical History:  Diagnosis Date  . ASD (atrial septal defect)    a. 03/2018 s/p closure w/ 2mm Amplatzer cribriform device and 71mm Amplatzer PFO occluder.  Marland Kitchen Heart murmur   . Hyperlipidemia   . Hypothyroidism   . Migraine    hx; would not occur without exertion; "none in years" (03/31/2018)  . Obesity   . Sinus bradycardia   . Trace mitral regurgitation by prior echocardiogram 01/31/2006  .  Trace tricuspid regurgitation by prior echocardiogram 01/31/2006    Past Surgical History:  Procedure Laterality Date  . ASD REPAIR  03/31/2018  . ATRIAL SEPTAL DEFECT(ASD) CLOSURE N/A 03/31/2018   Procedure: ATRIAL SEPTAL DEFECT (ASD) CLOSURE;  Surgeon: Sherren Mocha, MD;  Location: [place] [facility];  Service: Cardiovascular;  Laterality: N/A;  . DOPPLER ECHOCARDIOGRAPHY  01/31/2006  . ELECTROCARDIOGRAM  05/2008  . RIGHT HEART CATH N/A 12/16/2017   Procedure: RIGHT HEART CATH;  Surgeon: Belva Crome, MD;  Location: [place] [facility];  Service: Cardiovascular;  Laterality: N/A;  . TEE WITHOUT CARDIOVERSION N/A 11/28/2013   Procedure: TRANSESOPHAGEAL ECHOCARDIOGRAM (TEE);  Surgeon: Dorothy Spark, MD;  Location: [place];  Service: Cardiovascular;  Laterality: N/A;  . TEE WITHOUT CARDIOVERSION N/A 12/16/2017   Procedure: TRANSESOPHAGEAL ECHOCARDIOGRAM (TEE);  Surgeon: Larey Dresser, MD;  Location: [facility] ENDOSCOPY;  Service: Cardiovascular;  Laterality: N/A;    Current Medications: Outpatient Medications Prior to Visit  Medication Sig Dispense Refill  . aspirin 81 MG tablet Take 81 mg by mouth daily at 12 noon.     Marland Kitchen atorvastatin (LIPITOR) 20 MG tablet Take 1 tablet (20 mg total) by mouth daily. 90 tablet 3  . clopidogrel (PLAVIX) 75 MG tablet Take 1 tablet (75 mg total) by mouth daily. 95 tablet 0  . SYNTHROID 112 MCG tablet Take 1 tablet (112 mcg total) by mouth daily. 90 tablet 3  . valACYclovir (VALTREX) 1000 MG tablet Take 500 mg by mouth daily as needed (cold sores).    Marland Kitchen amoxicillin (AMOXIL) 500 MG tablet Take 4 tablets (2,000 mg total) by mouth once as needed for up to 1 dose (to be taken before dental procedures). 4 tablet 3   No facility-administered medications prior to visit.      Allergies:   Patient has no known allergies.   Social History   Socioeconomic History  . Marital status: Married    Spouse name: Not on file  . Number of children: 0  . Years of  education: Not on file  . Highest education level: Not on file  Occupational History  . Occupation: [place]    Employer: [employer]  . Financial resource strain: Not on file  . Food insecurity:    Worry: Not on file    Inability: Not on file  . Transportation needs:    Medical: Not on file    Non-medical: Not on file  Tobacco Use  . Smoking status: Never Smoker  . Smokeless tobacco: Never Used  Substance and Sexual Activity  . Alcohol use: Yes    Comment: 03/31/2018 "1-2 per month"  . Drug use: Not Currently    Types: Marijuana    Comment: quit 1981 -as a teenager  . Sexual activity: Yes  Lifestyle  . Physical activity:  Days per week: Not on file    Minutes per session: Not on file  . Stress: Not on file  Relationships  . Social connections:    Talks on phone: Not on file    Gets together: Not on file    Attends religious service: Not on file    Active member of club or organization: Not on file    Attends meetings of clubs or organizations: Not on file    Relationship status: Not on file  Other Topics Concern  . Not on file  Social History Narrative   Married and lives at home with her husband   No children   Reasonably active and will walk to work     Family History:  The patient's family history includes COPD in her father; Mitral valve prolapse in her mother.      ROS:   Please see the history of present illness.    ROS All other systems reviewed and are negative.   PHYSICAL EXAM:   VS:  BP 116/74   Pulse 75   Ht 5\' 8"  (1.727 m)   Wt 222 lb 6.4 oz (100.9 kg)   SpO2 95%   BMI 33.82 kg/m    GEN: Well nourished, well developed, in no acute distress HEENT: normal Neck: no JVD or masses Cardiac: RRR; soft murmur. No rubs, or gallops,no edema  Respiratory:  clear to auscultation bilaterally, normal work of breathing GI: soft, nontender, nondistended, + BS MS: no deformity or atrophy Skin: warm and dry, no rash Neuro:   Alert and Oriented x 3, Strength and sensation are intact Psych: euthymic mood, full affect   Wt Readings from Last 3 Encounters:  04/27/18 222 lb 6.4 oz (100.9 kg)  03/31/18 220 lb (99.8 kg)  03/23/18 224 lb 1.9 oz (101.7 kg)      Studies/Labs Reviewed:   EKG:  EKG is NOT ordered today.   Recent Labs: 03/23/2018: BUN 15; Creatinine, Ser 0.82; Hemoglobin 13.5; Platelets 327; Potassium 4.6; Sodium 143   Lipid Panel No results found for: CHOL, TRIG, HDL, CHOLHDL, VLDL, LDLCALC, LDLDIRECT  Additional studies/ records that were reviewed today include:  03/31/18 ATRIAL SEPTAL DEFECT (ASD) CLOSURE  Conclusion  Successful transcatheter closure of a complex secundum atrial septal defect secondary to multiple fenestrations.  The defects are closed with an 18 mm Amplatzer cribriform device and a 25 mm Amplatzer PFO occluder  The patient will be monitored overnight with a limited echocardiogram in the morning, anticipate hospital discharge tomorrow morning.  She should take aspirin and clopidogrel for 6 months and follow SBE prophylaxis when indicated x6 months.    ____________  Echo 04/27/2018 Study Conclusions - Left ventricle: The cavity size was normal. Systolic function was   normal. The estimated ejection fraction was in the range of 55%   to 60%. Wall motion was normal; there were no regional wall   motion abnormalities. Left ventricular diastolic function   parameters were normal. - Left atrium: The atrium was moderately dilated. - Atrial septum: No defect or patent foramen ovale was identified.   An Amplatzer closure device in the fossa ovalis region was   present.   ASSESSMENT & PLAN:   ASD s/p ASD closure: she is doing very well with an improvement in her shortness of breath. Echo today shows well seated closure device without shunting. She can stop plavix after 3 months and continue on aspirin indefinitely. SBE prophylaxis discussed; I have RX'd amoxicillin. She  understands this is  only for 6 months. I will see her back in 1year with echo with bubble study.   Medication Adjustments/Labs and Tests Ordered: Current medicines are reviewed at length with the patient today.  Concerns regarding medicines are outlined above.  Medication changes, Labs and Tests ordered today are listed in the Patient Instructions below. Patient Instructions  Medication Instructions:  1) You may STOP PLAVIX when you run out (this should be around 3/27) 2) Katie discussed the importance of taking an antibiotic prior to any dental visits for the next 6 months to prevent damage to the heart valves from infection. You were given a prescription for AMOXIL 2,000mg  to take 1 hour prior to dental visits until 09/30/2018.   Labwork: None  Follow-Up: You will be called to schedule your one year closure echocardiogram and office visit!    Signed, Angelena Form, PA-C  04/27/2018 6:04 PM    [facility] [place], [place], [place]  [address] Phone: [phone]; Fax: [phone]

## 2018-04-27 NOTE — Patient Instructions (Signed)
Medication Instructions:  1) You may STOP PLAVIX when you run out (this should be around 3/27) 2) Katie discussed the importance of taking an antibiotic prior to any dental visits for the next 6 months to prevent damage to the heart valves from infection. You were given a prescription for AMOXIL 2,000mg  to take 1 hour prior to dental visits until 09/30/2018.   Labwork: None  Follow-Up: You will be called to schedule your one year closure echocardiogram and office visit!

## 2018-05-01 LAB — CYTOLOGY - PAP
DIAGNOSIS: NEGATIVE
HPV: NOT DETECTED

## 2018-05-05 DIAGNOSIS — H5203 Hypermetropia, bilateral: Secondary | ICD-10-CM | POA: Diagnosis not present

## 2018-05-05 DIAGNOSIS — H524 Presbyopia: Secondary | ICD-10-CM | POA: Diagnosis not present

## 2018-05-05 DIAGNOSIS — H52221 Regular astigmatism, right eye: Secondary | ICD-10-CM | POA: Diagnosis not present

## 2018-06-28 ENCOUNTER — Ambulatory Visit: Payer: 59 | Admitting: Family Medicine

## 2018-06-29 ENCOUNTER — Ambulatory Visit: Payer: 59 | Admitting: Sports Medicine

## 2018-07-19 MED FILL — SYNTHROID 112 MCG TABLET: 112 | 90 days supply | Qty: 90 | Fill #0

## 2018-07-19 MED FILL — ATORVASTATIN 20 MG TABLET: 20 | 90 days supply | Qty: 90 | Fill #0

## 2018-10-11 MED FILL — ATORVASTATIN 20 MG TABLET: 20 | 90 days supply | Qty: 90 | Fill #1

## 2018-10-11 MED FILL — SYNTHROID 112 MCG TABLET: 112 | 90 days supply | Qty: 90 | Fill #1

## 2019-01-09 MED FILL — ATORVASTATIN 20 MG TABLET: 20 | 90 days supply | Qty: 90 | Fill #2

## 2019-01-09 MED FILL — SYNTHROID 112 MCG TABLET: 112 | 90 days supply | Qty: 90 | Fill #2

## 2019-02-23 ENCOUNTER — Encounter: Payer: Self-pay | Admitting: Family Medicine

## 2019-02-23 ENCOUNTER — Ambulatory Visit: Payer: Self-pay

## 2019-02-23 ENCOUNTER — Other Ambulatory Visit: Payer: Self-pay

## 2019-02-23 ENCOUNTER — Ambulatory Visit: Payer: 59 | Admitting: Family Medicine

## 2019-02-23 DIAGNOSIS — M25512 Pain in left shoulder: Secondary | ICD-10-CM | POA: Diagnosis not present

## 2019-02-23 MED ORDER — METHYLPREDNISOLONE ACETATE 40 MG/ML IJ SUSP
40.0000 mg | Freq: Once | INTRAMUSCULAR | Status: AC
  Administered 2019-02-23: 40 mg via INTRA_ARTICULAR

## 2019-02-23 NOTE — Progress Notes (Signed)
PCP: Donald Prose, MD  Subjective:   HPI: Patient is a 58 y.o. female here for left shoulder pain.  Patient reports having several months worsening lateral left shoulder pain. Bothers her with reaching overhead, now waking her up from sleeping. Pain is sharp. Has tried some home motion exercises with minimal benefit. No skin changes. Some radiation into bicep area and notes some tingling into fingers. No neck pain.  Past Medical History:  Diagnosis Date  . ASD (atrial septal defect)    a. 03/2018 s/p closure w/ 37mm Amplatzer cribriform device and 72mm Amplatzer PFO occluder.  Marland Kitchen Heart murmur   . Hyperlipidemia   . Hypothyroidism   . Migraine    hx; would not occur without exertion; "none in years" (03/31/2018)  . Obesity   . Sinus bradycardia   . Trace mitral regurgitation by prior echocardiogram 01/31/2006  . Trace tricuspid regurgitation by prior echocardiogram 01/31/2006    Current Outpatient Medications on File Prior to Visit  Medication Sig Dispense Refill  . amoxicillin (AMOXIL) 500 MG tablet Take 4 tablets (2,000 mg total) by mouth once as needed for up to 1 dose (to be taken before dental procedures). 12 tablet 3  . aspirin 81 MG tablet Take 81 mg by mouth daily at 12 noon.     Marland Kitchen atorvastatin (LIPITOR) 20 MG tablet Take 1 tablet (20 mg total) by mouth daily. 90 tablet 3  . SYNTHROID 112 MCG tablet Take 1 tablet (112 mcg total) by mouth daily. 90 tablet 3  . valACYclovir (VALTREX) 1000 MG tablet Take 500 mg by mouth daily as needed (cold sores).     No current facility-administered medications on file prior to visit.     Past Surgical History:  Procedure Laterality Date  . ASD REPAIR  03/31/2018  . ATRIAL SEPTAL DEFECT(ASD) CLOSURE N/A 03/31/2018   Procedure: ATRIAL SEPTAL DEFECT (ASD) CLOSURE;  Surgeon: Sherren Mocha, MD;  Location: [facility];  Service: Cardiovascular;  Laterality: N/A;  . DOPPLER ECHOCARDIOGRAPHY  01/31/2006  . ELECTROCARDIOGRAM   05/2008  . RIGHT HEART CATH N/A 12/16/2017   Procedure: RIGHT HEART CATH;  Surgeon: Belva Crome, MD;  Location: [facility];  Service: Cardiovascular;  Laterality: N/A;  . TEE WITHOUT CARDIOVERSION N/A 11/28/2013   Procedure: TRANSESOPHAGEAL ECHOCARDIOGRAM (TEE);  Surgeon: Dorothy Spark, MD;  Location: [facility];  Service: Cardiovascular;  Laterality: N/A;  . TEE WITHOUT CARDIOVERSION N/A 12/16/2017   Procedure: TRANSESOPHAGEAL ECHOCARDIOGRAM (TEE);  Surgeon: Larey Dresser, MD;  Location: [facility];  Service: Cardiovascular;  Laterality: N/A;    No Known Allergies  Social History   Socioeconomic History  . Marital status: Married    Spouse name: Not on file  . Number of children: 0  . Years of education: Not on file  . Highest education level: Not on file  Occupational History  . Occupation: [place]    Employer: [employer]  . Financial resource strain: Not on file  . Food insecurity    Worry: Not on file    Inability: Not on file  . Transportation needs    Medical: Not on file    Non-medical: Not on file  Tobacco Use  . Smoking status: Never Smoker  . Smokeless tobacco: Never Used  Substance and Sexual Activity  . Alcohol use: Yes    Comment: 03/31/2018 "1-2 per month"  . Drug use: Not Currently    Types: Marijuana    Comment: quit  41 -as a teenager  . Sexual activity: Yes  Lifestyle  . Physical activity    Days per week: Not on file    Minutes per session: Not on file  . Stress: Not on file  Relationships  . Social Herbalist on phone: Not on file    Gets together: Not on file    Attends religious service: Not on file    Active member of club or organization: Not on file    Attends meetings of clubs or organizations: Not on file    Relationship status: Not on file  . Intimate partner violence    Fear of current or ex partner: Not on file    Emotionally abused: Not on file    Physically abused: Not  on file    Forced sexual activity: Not on file  Other Topics Concern  . Not on file  Social History Narrative   Married and lives at home with her husband   No children   Reasonably active and will walk to work    Family History  Problem Relation Age of Onset  . COPD Father   . Mitral valve prolapse Mother        Prothesis    There were no vitals taken for this visit.  Review of Systems: See HPI above.     Objective:  Physical Exam:  Gen: NAD, comfortable in exam room  Left shoulder: No swelling, ecchymoses.  No gross deformity. TTP proximal biceps tendon and muscle.  No AC, other tenderness. FROM with painful arc. Negative Hawkins, Neers. Negative Yergasons. Strength 4/5 with empty can and external rotation, painful.  5/5 with IR without pain. NV intact distally.  Right shoulder: No swelling, ecchymoses.  No gross deformity. FROM. Strength 5/5 with empty can and resisted internal/external rotation. NV intact distally.  Neck: No gross deformity, swelling, bruising. TTP left trapezius.  No midline/bony TTP. FROM without reproduction of pain. BUE strength 5/5 except RTC testing noted above.   Sensation intact to light touch.     MSK u/s left shoulder: Biceps tendon intact on long and trans views - no tenosynovitis Pec major tendon intact without abnormalities. Subscapularis without obvious tear. AC joint with mod arthropathy and effusion. Infraspinatus intact without tear Subacromial bursitis Supraspinatus with very small nonretracted interstitial tear at insertion.  No other abnormalities. Post shoulder without abnormalities of visible labrum, glenoid.  Assessment & Plan:  1. Left shoulder pain - consistent with subacromial bursitis, rotator cuff tendinopathy.  Shown home exercises to do daily.  Discussed Tylenol, nsaids.  Subacromial injection given today.  Consider physical therapy if not improving.  After informed written consent timeout was performed,  patient was seated in chair in exam room. Left shoulder was prepped with alcohol swab and utilizing lateral approach with ultrasound guidance, patient's left subacromial space was injected with 3:1 bupivicaine: depomedrol. Patient tolerated the procedure well without immediate complications.

## 2019-03-23 NOTE — Telephone Encounter (Signed)
Called and spoke with patient. To accommodate work schedule, she will have bubble study 12/30 and virtual visit 12/31 with Nell Range. She was grateful for assistance.

## 2019-04-04 ENCOUNTER — Ambulatory Visit (HOSPITAL_COMMUNITY): Payer: 59 | Attending: Cardiology

## 2019-04-04 ENCOUNTER — Other Ambulatory Visit: Payer: Self-pay

## 2019-04-04 DIAGNOSIS — Z8774 Personal history of (corrected) congenital malformations of heart and circulatory system: Secondary | ICD-10-CM | POA: Insufficient documentation

## 2019-04-04 MED ORDER — SODIUM CHLORIDE 0.9% FLUSH
10.0000 mL | INTRAVENOUS | Status: AC | PRN
  Administered 2019-04-04: 10 mL via INTRAVENOUS

## 2019-04-04 NOTE — Progress Notes (Addendum)
[facility]   MULTIDISCIPLINARY HEART VALVE TEAM  Virtual Visit via Video Note   This visit type was conducted due to national recommendations for restrictions regarding the COVID-19 Pandemic (e.g. social distancing) in an effort to limit this patient's exposure and mitigate transmission in our community.  Due to her co-morbid illnesses, this patient is at least at moderate risk for complications without adequate follow up.  This format is felt to be most appropriate for this patient at this time.  All issues noted in this document were discussed and addressed.  A limited physical exam was performed with this format.  Please refer to the patient's chart for her consent to telehealth for [facility].   Evaluation Performed:  Follow-up visit  Date:  04/05/2019   ID:  Erin Rivas, Erin Rivas 05/04/1960, MRN [id number]  Patient Location: Home Provider Location: Office  PCP:  Donald Prose, MD  Cardiologist:  Sherren Mocha, MD  Electrophysiologist:  None   CC: 1 year s/p ASD closure.   History of Present Illness:  Erin Rivas is a 58 y.o. female with a history of HLD, hypothyroidism, obesity, ASD with right ventricular and biatrial enlargement s/p ASD closure (03/31/18) who presents for follow up.   About 5 years ago she was noted to have a heart murmur on examination and then underwent an extensive cardiac evaluation. This included a CTA of the heart and a transesophageal echocardiogram. The latter demonstrated multiple tiny secundum atrial septal defects. Her right heart was not enlarged and she was not experiencing any symptoms and so no intervention was performed. In 2019, she noted progressive exercise intolerance as well as exertional dyspnea. Transthoracic echo suggested right ventricular dilitation as well as moderate right atrial and severe left atrial enlargement. She was then sent for a cardiac MRI which showed mild RV dilitation, biatrial enlargement and a Qp Qs of  ~1.4:1. The interatrial septum was not well seen. She was referred to Dr. Burt Knack and was sent for a repeat transesophageal echo which showed a small secundum ASD in the mid-septum as well as an aneurysmal deformation of the inferoposterior septum with at least 2 or 3 smaller defects in this region. She was then referred for a heart catheterization which demonstrated a QP:QS of 1.3:1 and normal right heart pressures. She was underwent repeat TEE which showed a multifenestrated ASD with "at least 3 separate defects in the mid and inferior septum near the IVC." Pulmonary venous drainage was noted to be normal. Dr. Burt Knack reviewed her case and wanted further opinion about whether an intervention, surgery or continued medical observation was the best clinical option. She was seen by Dr. Corine Shelter at [facility] on 01/02/18 for second opinion who felt "that a single device could be tried, which would close the largest defect and the outer edges would be able to sandwich the outside lesions."  Closure was set up with him, but due to insurance issues and a desire to have Dr. Burt Knack do her operation, she decided to have her surgery with [facility].   She underwent successful transcatheter closure of a complex secundum atrial septal defect secondary to multiple fenestrations by Dr. Burt Knack on 03/31/2018. Post op echocardiogram showed no obvious shunt. She was discharged on Aspirin and Plavix for 3 months followed by aspirin indefinitely. 1 month echo showed EF 55%, normal RA/RV and normally functioning ASD closure.   Today she presents to clinic for follow up. No CP or SOB. No LE edema, orthopnea or PND. No dizziness  or syncope. No blood in stool or urine. No palpitations. Still working at [employer]. Has no complaints. Feels great.   The patient does not have symptoms concerning for COVID-19 infection (fever, chills, cough, or new shortness of breath).    Past Medical History:  Diagnosis Date  . ASD (atrial septal defect)    a.  03/2018 s/p closure w/ 102mm Amplatzer cribriform device and 32mm Amplatzer PFO occluder.  Marland Kitchen Heart murmur   . Hyperlipidemia   . Hypothyroidism   . Migraine    hx; would not occur without exertion; "none in years" (03/31/2018)  . Obesity   . Sinus bradycardia   . Trace mitral regurgitation by prior echocardiogram 01/31/2006  . Trace tricuspid regurgitation by prior echocardiogram 01/31/2006   Past Surgical History:  Procedure Laterality Date  . ASD REPAIR  03/31/2018  . ATRIAL SEPTAL DEFECT(ASD) CLOSURE N/A 03/31/2018   Procedure: ATRIAL SEPTAL DEFECT (ASD) CLOSURE;  Surgeon: Sherren Mocha, MD;  Location: [facility];  Service: Cardiovascular;  Laterality: N/A;  . DOPPLER ECHOCARDIOGRAPHY  01/31/2006  . ELECTROCARDIOGRAM  05/2008  . RIGHT HEART CATH N/A 12/16/2017   Procedure: RIGHT HEART CATH;  Surgeon: Belva Crome, MD;  Location: [facility];  Service: Cardiovascular;  Laterality: N/A;  . TEE WITHOUT CARDIOVERSION N/A 11/28/2013   Procedure: TRANSESOPHAGEAL ECHOCARDIOGRAM (TEE);  Surgeon: Dorothy Spark, MD;  Location: [facility];  Service: Cardiovascular;  Laterality: N/A;  . TEE WITHOUT CARDIOVERSION N/A 12/16/2017   Procedure: TRANSESOPHAGEAL ECHOCARDIOGRAM (TEE);  Surgeon: Larey Dresser, MD;  Location: [facility];  Service: Cardiovascular;  Laterality: N/A;     Current Meds  Medication Sig  . aspirin 81 MG tablet Take 81 mg by mouth daily at 12 noon.   Marland Kitchen atorvastatin (LIPITOR) 20 MG tablet Take 1 tablet (20 mg total) by mouth daily.  Marland Kitchen SYNTHROID 112 MCG tablet Take 1 tablet (112 mcg total) by mouth daily.  . valACYclovir (VALTREX) 1000 MG tablet Take 500 mg by mouth daily as needed (cold sores).     Allergies:   Patient has no known allergies.   Social History   Tobacco Use  . Smoking status: Never Smoker  . Smokeless tobacco: Never Used  Substance Use Topics  . Alcohol use: Yes    Comment: 03/31/2018 "1-2 per month"  . Drug use: Not  Currently    Types: Marijuana    Comment: quit 1981 -as a teenager     Family Hx: The patient's family history includes COPD in her father; Mitral valve prolapse in her mother.  ROS:   Please see the history of present illness.    All other systems reviewed and are negative.   Prior CV studies:   The following studies were reviewed today:  03/31/18 ATRIAL SEPTAL DEFECT (ASD) CLOSURE  Conclusion  Successful transcatheter closure of a complex secundum atrial septal defect secondary to multiple fenestrations.  The defects are closed with an 18 mm Amplatzer cribriform device and a 25 mm Amplatzer PFO occluder  The patient will be monitored overnight with a limited echocardiogram in the morning, anticipate hospital discharge tomorrow morning.  She should take aspirin and clopidogrel for 6 months and follow SBE prophylaxis when indicated x6 months.    ____________  Echo 04/27/2018 Study Conclusions - Left ventricle: The cavity size was normal. Systolic function was   normal. The estimated ejection fraction was in the range of 55%   to 60%. Wall motion was normal; there were no regional wall  motion abnormalities. Left ventricular diastolic function   parameters were normal. - Left atrium: The atrium was moderately dilated. - Atrial septum: No defect or patent foramen ovale was identified.   An Amplatzer closure device in the fossa ovalis region was   present.  ________________  Echo w/ bubble 04/04/19 IMPRESSIONS  1. Left ventricular ejection fraction, by visual estimation, is 65 to 70%. The left ventricle has hyperdynamic function.  2. Left ventricular diastolic parameters are consistent with Grade I diastolic dysfunction (impaired relaxation).  3. Global right ventricle has normal systolc function. no increase in right ventricular wall thickness.  4. Left atrial size was moderately dilated.  5. Right atrial size was mildly dilated.  6. The mitral valve is normal in  structure. Trivial mitral valve regurgitation.  7. The tricuspid valve was normal in structure. Tricuspid valve regurgitation is not demonstrated.  8. Tricuspid valve regurgitation is not demonstrated.  9. S/P Amplatzer device placement. Negative bubble study.  Right Ventricle: No increase in right ventricular wall thickness. Global RV systolic function is has normal systolic function.  Left Atrium: Left atrial size was moderately dilated.  Right Atrium: Right atrial size was mildly dilated. Right atrial pressure is estimated at 3 mmHg  Shunts: Agitated saline contrast was given intravenously to evaluate for intracardiac shunting. Saline contrast bubble study was negative, with no evidence of any interatrial shunt. No atrial level shunt detected by color flow Doppler.   Labs/Other Tests and Data Reviewed:    EKG:  No ECG reviewed.  Recent Labs: No results found for requested labs within last 8760 hours.   Recent Lipid Panel No results found for: CHOL, TRIG, HDL, CHOLHDL, LDLCALC, LDLDIRECT  Wt Readings from Last 3 Encounters:  04/05/19 223 lb 6.4 oz (101.3 kg)  04/27/18 222 lb 6.4 oz (100.9 kg)  03/31/18 220 lb (99.8 kg)     Objective:    Vital Signs:  Ht 5\' 8"  (1.727 m)   Wt 223 lb 6.4 oz (101.3 kg)   BMI 33.97 kg/m    Well nourished, well developed female in no acute distress.   ASSESSMENT & PLAN:    ASD s/p ASD closure: she is doing excellent. Echo 12/30 showed EF 65%, normally functioning Amplatzer device with negative bubble study. Normal RV function. Mild RA dilation and mod LA dilation. Continue on baby aspirin. No longer needs SBE prophylaxis. I will discuss if she will require long term or PRN cardiology follow up with Dr. Burt Knack.    COVID-19 Education: The signs and symptoms of COVID-19 were discussed with the patient and how to seek care for testing (follow up with PCP or arrange E-visit).  The importance of social distancing was discussed today.  Time:     Today, I have spent 11 minutes with the patient with telehealth technology discussing the above problems.     Medication Adjustments/Labs and Tests Ordered: Current medicines are reviewed at length with the patient today.  Concerns regarding medicines are outlined above.   Tests Ordered: No orders of the defined types were placed in this encounter.   Medication Changes: No orders of the defined types were placed in this encounter.    Disposition:  Follow up will discuss with Dr. Burt Knack  Signed, Angelena Form, PA-C  04/05/2019 1:43 PM    [place]

## 2019-04-05 ENCOUNTER — Telehealth (INDEPENDENT_AMBULATORY_CARE_PROVIDER_SITE_OTHER): Payer: 59 | Admitting: Physician Assistant

## 2019-04-05 VITALS — Ht 68.0 in | Wt 223.4 lb

## 2019-04-05 DIAGNOSIS — Z8774 Personal history of (corrected) congenital malformations of heart and circulatory system: Secondary | ICD-10-CM | POA: Diagnosis not present

## 2019-04-05 NOTE — Progress Notes (Signed)
I'd like to see her back with a repeat echo in 2 years. Thanks and glad she's doing well!

## 2019-04-09 MED FILL — SYNTHROID 112 MCG TABLET: 112 | 90 days supply | Qty: 90 | Fill #0

## 2019-04-09 MED FILL — ATORVASTATIN 20 MG TABLET: 20 | 90 days supply | Qty: 90 | Fill #0

## 2019-04-10 NOTE — Progress Notes (Signed)
Will arrange repeat echo and OV with Dr. Burt Knack in 2 years.

## 2019-05-02 DIAGNOSIS — Z1231 Encounter for screening mammogram for malignant neoplasm of breast: Secondary | ICD-10-CM | POA: Diagnosis not present

## 2019-05-07 DIAGNOSIS — H5203 Hypermetropia, bilateral: Secondary | ICD-10-CM | POA: Diagnosis not present

## 2019-05-07 DIAGNOSIS — H524 Presbyopia: Secondary | ICD-10-CM | POA: Diagnosis not present

## 2019-05-07 DIAGNOSIS — H40052 Ocular hypertension, left eye: Secondary | ICD-10-CM | POA: Diagnosis not present

## 2019-05-07 DIAGNOSIS — H25819 Combined forms of age-related cataract, unspecified eye: Secondary | ICD-10-CM | POA: Diagnosis not present

## 2019-05-07 DIAGNOSIS — H52221 Regular astigmatism, right eye: Secondary | ICD-10-CM | POA: Diagnosis not present

## 2019-05-16 DIAGNOSIS — E039 Hypothyroidism, unspecified: Secondary | ICD-10-CM | POA: Diagnosis not present

## 2019-05-16 DIAGNOSIS — Z0000 Encounter for general adult medical examination without abnormal findings: Secondary | ICD-10-CM | POA: Diagnosis not present

## 2019-05-16 DIAGNOSIS — L989 Disorder of the skin and subcutaneous tissue, unspecified: Secondary | ICD-10-CM | POA: Diagnosis not present

## 2019-05-16 DIAGNOSIS — E7800 Pure hypercholesterolemia, unspecified: Secondary | ICD-10-CM | POA: Diagnosis not present

## 2019-05-16 DIAGNOSIS — I429 Cardiomyopathy, unspecified: Secondary | ICD-10-CM | POA: Diagnosis not present

## 2019-05-16 DIAGNOSIS — N951 Menopausal and female climacteric states: Secondary | ICD-10-CM | POA: Diagnosis not present

## 2019-05-16 MED FILL — ESTRADIOL 0.1 MG/GM CREA: 0.1 | 63 days supply | Qty: 43 | Fill #0

## 2019-06-06 ENCOUNTER — Other Ambulatory Visit: Payer: Self-pay | Admitting: *Deleted

## 2019-06-06 DIAGNOSIS — M25512 Pain in left shoulder: Secondary | ICD-10-CM

## 2019-06-06 MED ORDER — NITROGLYCERIN 0.2 MG/HR TD PT24: MEDICATED_PATCH | TRANSDERMAL | 1 refills | Status: DC

## 2019-06-06 MED FILL — NITROGLYCERIN 0.2 MG/HR PTC: 0.2 | 84 days supply | Qty: 21 | Fill #0

## 2019-06-07 ENCOUNTER — Ambulatory Visit (INDEPENDENT_AMBULATORY_CARE_PROVIDER_SITE_OTHER): Payer: 59 | Admitting: Rehabilitative and Restorative Service Providers"

## 2019-06-07 ENCOUNTER — Other Ambulatory Visit: Payer: Self-pay

## 2019-06-07 DIAGNOSIS — M6281 Muscle weakness (generalized): Secondary | ICD-10-CM

## 2019-06-07 DIAGNOSIS — M25512 Pain in left shoulder: Secondary | ICD-10-CM

## 2019-06-07 DIAGNOSIS — R293 Abnormal posture: Secondary | ICD-10-CM

## 2019-06-07 NOTE — Therapy (Signed)
[place], [place], [address] Phone: [phone]   Fax:  [phone]  Physical Therapy Treatment  Patient Details  Name: Erin Rivas MRN: [place]:[id number] Date of Birth: [birth year] Referring Provider (PT): Karlton Lemon, MD   Encounter Date: 06/07/2019  PT End of Session - 06/07/19 1456    Visit Number  1    Number of Visits  12    Date for PT Re-Evaluation  07/19/19    PT Start Time  1400    PT Stop Time  1448    PT Time Calculation (min)  48 min       Past Medical History:  Diagnosis Date  . ASD (atrial septal defect)    a. 03/2018 s/p closure w/ 25mm Amplatzer cribriform device and 49mm Amplatzer PFO occluder.  Marland Kitchen Heart murmur   . Hyperlipidemia   . Hypothyroidism   . Migraine    hx; would not occur without exertion; "none in years" (03/31/2018)  . Obesity   . Sinus bradycardia   . Trace mitral regurgitation by prior echocardiogram 01/31/2006  . Trace tricuspid regurgitation by prior echocardiogram 01/31/2006    Past Surgical History:  Procedure Laterality Date  . ASD REPAIR  03/31/2018  . ATRIAL SEPTAL DEFECT(ASD) CLOSURE N/A 03/31/2018   Procedure: ATRIAL SEPTAL DEFECT (ASD) CLOSURE;  Surgeon: Sherren Mocha, MD;  Location: [facility];  Service: Cardiovascular;  Laterality: N/A;  . DOPPLER ECHOCARDIOGRAPHY  01/31/2006  . ELECTROCARDIOGRAM  05/2008  . RIGHT HEART CATH N/A 12/16/2017   Procedure: RIGHT HEART CATH;  Surgeon: Belva Crome, MD;  Location: [facility];  Service: Cardiovascular;  Laterality: N/A;  . TEE WITHOUT CARDIOVERSION N/A 11/28/2013   Procedure: TRANSESOPHAGEAL ECHOCARDIOGRAM (TEE);  Surgeon: Dorothy Spark, MD;  Location: [facility];  Service: Cardiovascular;  Laterality: N/A;  . TEE WITHOUT CARDIOVERSION N/A 12/16/2017   Procedure: TRANSESOPHAGEAL ECHOCARDIOGRAM (TEE);  Surgeon: Larey Dresser, MD;  Location: [facility];  Service:  Cardiovascular;  Laterality: N/A;    There were no vitals filed for this visit.  Subjective Assessment - 06/07/19 1405    Subjective  The patient reports gradual onset of L shoulder pain in 01/2019 noting a slow progression of discomfort now having difficulty sleeping.   Driving and sleep increase pain, she is limited during ADLs and notes achiness at rest.    Patient Stated Goals  Be able to sleep through the night, be pain free    Currently in Pain?  Yes    Pain Score  3    goes up to a 5/10 with reaching   Pain Location  Shoulder    Pain Orientation  Left    Pain Descriptors / Indicators  Aching    Pain Type  Acute pain    Pain Onset  More than a month ago    Pain Frequency  Intermittent    Aggravating Factors   reaching overhead, driving, sleeping    Pain Relieving Factors  heat         [facility] PT Assessment - 06/07/19 1411      Assessment   Medical Diagnosis  M25.512 (ICD-10-CM) - Left shoulder pain, unspecified chronicity    Referring Provider (PT)  Karlton Lemon, MD    Onset Date/Surgical Date  --   01/2019   Hand Dominance  Right    Prior Therapy  none      Precautions   Precautions  None  Restrictions   Weight Bearing Restrictions  No      Balance Screen   Has the patient fallen in the past 6 months  No    Has the patient had a decrease in activity level because of a fear of falling?   No    Is the patient reluctant to leave their home because of a fear of falling?   No      Home Film/video editor residence      Prior Function   Level of Independence  Independent      Observation/Other Assessments   Focus on Therapeutic Outcomes (FOTO)   51% (49% limited)      Sensation   Light Touch  Appears Intact   has had a couple of episodes of feeling pain in hand     Posture/Postural Control   Posture/Postural Control  Postural limitations    Postural Limitations  Rounded Shoulders;Forward head      ROM / Strength   AROM / PROM /  Strength  AROM;PROM;Strength      AROM   Overall AROM   Deficits    AROM Assessment Site  Shoulder    Right/Left Shoulder  Right;Left    Left Shoulder Flexion  85 Degrees   fingers tingle, + for pain anterior shoulder/biceps   Left Shoulder ABduction  120 Degrees   painful at her end range and painful arc descending   Left Shoulder Internal Rotation  70 Degrees   with pain (PT supports arm 70 deg abd)   Left Shoulder External Rotation  42 Degrees   pain at end range (pt supports arm at 70 deg abd)   Left Shoulder Horizontal ABduction  --   reaches to touch the right shoulder + posterior pain, achy     PROM   PROM Assessment Site  Shoulder    Right/Left Shoulder  Left    Left Shoulder Flexion  160 Degrees   in supine   Left Shoulder ABduction  140 Degrees   in supine   Left Shoulder Internal Rotation  70 Degrees   in supine arm abducted to 90   Left Shoulder External Rotation  90 Degrees   arm abducted to 90     Strength   Overall Strength Comments  Patient can tolerate isometric contraction for flexion, ER, IR without incrased pain.  Isometric abduction is painful in middle deltoid region.      Palpation   Palpation comment  Anterior deltoid, bicipital groove, middle deltoid tightness and tenderness to palpation                   [facility] Adult PT Treatment/Exercise - 06/07/19 1411      Exercises   Exercises  Shoulder      Shoulder Exercises: Supine   Horizontal ABduction  AAROM;Left;10 reps    Flexion  AAROM;Left;10 reps      Shoulder Exercises: Standing   Retraction  Strengthening;Both;10 reps      Shoulder Exercises: Stretch   Corner Stretch  1 rep;20 seconds    Corner Stretch Limitations  ant pec stretch       Modalities   Modalities  Iontophoresis      Iontophoresis   Type of Iontophoresis  Dexamethasone    Location  left bicipital groove    Dose  1.0cc    Time  4 hour patch      Manual Therapy   Manual Therapy  Soft tissue mobilization  Soft tissue mobilization  L STM and IASTM for anterior, middle deltoid and biceps.             PT Education - 06/07/19 1455    Education Details  HEP    Person(s) Educated  Patient    Methods  Explanation;Demonstration;Handout    Comprehension  Returned demonstration;Verbalized understanding          PT Long Term Goals - 06/07/19 1456      PT LONG TERM GOAL #1   Title  The patient will retun demo HEP for L shoulder ROM, strengthening and stretching.    Time  6    Period  Weeks    Target Date  07/19/19      PT LONG TERM GOAL #2   Title  The patient will reduce functional limitation from 49% to < or equal to 32%.    Time  6    Period  Weeks    Target Date  07/19/19      PT LONG TERM GOAL #3   Title  The patient will improve AROM for functional use of L UE to 140 degrees flexion, 150 degrees abduction and 60 degrees ER.    Baseline  seated AROM is 85 deg flexion, 120 abd, and 42 ER (arm supported at 70 degrees abduction).    Time  6    Period  Weeks    Target Date  07/19/19      PT LONG TERM GOAL #4   Title  The patient will report being able to sleep without waking from pain in the L shoulder.    Time  6    Period  Weeks    Target Date  07/19/19      PT LONG TERM GOAL #5   Title  The patient will demonstrate reaching behind her head for ADLs.    Time  6    Period  Weeks    Target Date  07/19/19            Plan - 06/07/19 1514    Clinical Impression Statement  The patient is a 59 year old female presenting to outpatient physical therapy with 5 month h/o gradually worsening L shoulder pain.  She has impairments of decreased AROM, tightness deltoids, tenderness L bicipital groove, decreased muscle strength, painful arc of motion.  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  PT to address deficits to return to prior level of function.    Examination-Activity Limitations  Lift;Reach  Overhead;Carry    Examination-Participation Restrictions  Cleaning;Driving    Stability/Clinical Decision Making  Stable/Uncomplicated    Clinical Decision Making  Low    Rehab Potential  Excellent    PT Frequency  2x / week    PT Duration  6 weeks    PT Treatment/Interventions  ADLs/Self Care Home Management;Patient/family education;Therapeutic activities;Therapeutic exercise;Neuromuscular re-education;Electrical Stimulation;Iontophoresis 4mg /ml Dexamethasone;Moist Heat;Cryotherapy;Taping;Dry needling;Manual techniques;Passive range of motion    PT Next Visit Plan  progress HEP adding isometrics for flexion, IR/ER; work on [employer] and/or dry needling to reduce trigger points and muscle guarding, continue ionto for inflammation to tolerance and progress strengthening as AROM improves.    PT Home Exercise Plan  Access Code: [email or web address]    Consulted and Agree with Plan of Care  Patient       Patient will benefit from skilled therapeutic intervention in order to improve the following deficits and impairments:  Increased fascial restricitons, Impaired flexibility,  Decreased strength, Decreased range of motion, Postural dysfunction, Pain, Hypomobility, Decreased activity tolerance  Visit Diagnosis: Acute pain of left shoulder  Muscle weakness (generalized)  Abnormal posture     Problem List Patient Active Problem List   Diagnosis Date Noted  . Anal fissure 08/22/2012  . Special screening for malignant neoplasms, colon 08/22/2012  . ASD (atrial septal defect) 09/10/2010  . Obesity 09/10/2010  . Dyslipidemia 09/10/2010  . ROTATOR CUFF SYNDROME, RIGHT 09/23/2009    Lott Seelbach, PT 06/07/2019, 3:18 PM  [facility] [place], [place], [address] Phone: [phone]   Fax:  [phone]  Name: Erin Rivas MRN: [place]:[id number] Date of Birth: February 05, 1961

## 2019-06-07 NOTE — Patient Instructions (Addendum)
Access Code: [email or web address]  URL: [email or web address]  Date: 06/07/2019  Prepared by: Rudell Cobb   Exercises Supine Shoulder Flexion Extension AAROM with Dowel - 10 reps - 1 sets - 2x daily - 7x weekly Supine Shoulder Horizontal Abduction Adduction AAROM with Dowel - 10 reps - 1 sets - 2x daily - 7x weekly Standing Scapular Retraction - 10 reps - 1 sets - 3-5 seconds hold                            - 2x daily - 7x weekly Doorway Pec Stretch at 60 Degrees Abduction with Arm Straight - 3 reps - 1 sets - 20-30 seconds hold - 2x daily - 7x weekly  IONTOPHORESIS PATIENT PRECAUTIONS & CONTRAINDICATIONS   Redness under one or both electrodes can occur.  This is characterized by a uniform redness that usually disappears within 12 hours of treatment.  Small pinhead size blisters may result in response to the drug.  Contact your physician if the problem persists more than 24 hours.  On rare occasions, iontophoresis therapy can result in temporary skin reactions such as rash, inflammation, irritation or burns.  The skin reaction bay be the result of individual sensitivity to the ionic solution used, the condition of the skin at the start of treatment, reaction to the materials in the electrodes, allergies or sensitivity to dexamethasone, or a poor connection between the patch and your skin.  Discontinue using iontophoresis if you have any of these reactions and report to your therapist.  Remove the Patch or electrodes if you have any undue sensation of pain or burning during the treatment and report discomfort to your therapist.  Tell your Therapist if you have had known adverse reactions to the application of electrical current.  If using the Patch, the LED light will turn off when treatment is complete and the patch can be removed.  Approximate treatment time is 1-3 hours.  Remove the patch when light goes off or after 6 hours.  The Patch can be worn during normal activity,  however excessive motion where the electrodes have been placed can cause poor contact between the skin and the electrode or uneven electrical current resulting in greater risk of skin irritation.  Keep out of the reach of children.   DO NOT use if you have a cardiac pacemaker or any other electrically sensitive  Implanted device.  DO NOT use if you have known sensitivity to Dexamethasone.  DO NOT use during Magnetic Resonance Imaging (MRI).  DO NOT use over broken or compromised skin (e.g. Sunburn, cuts or acne) due to the increased risk of skin reaction.  DO NOT SHAVE over the area to be treated:  To establish good contact between the Patch and the skin excessive hair may be clipped.  DO NOT place the Patch or electrodes on or over your eyes, directly over your heart or brain.  DO NOT reuse the Patch or electrodes as this may cause burns to occur.

## 2019-06-11 ENCOUNTER — Ambulatory Visit (INDEPENDENT_AMBULATORY_CARE_PROVIDER_SITE_OTHER): Payer: 59 | Admitting: Physical Therapy

## 2019-06-11 ENCOUNTER — Encounter: Payer: Self-pay | Admitting: Physical Therapy

## 2019-06-11 ENCOUNTER — Other Ambulatory Visit: Payer: Self-pay

## 2019-06-11 DIAGNOSIS — R293 Abnormal posture: Secondary | ICD-10-CM | POA: Diagnosis not present

## 2019-06-11 DIAGNOSIS — M25512 Pain in left shoulder: Secondary | ICD-10-CM

## 2019-06-11 DIAGNOSIS — M6281 Muscle weakness (generalized): Secondary | ICD-10-CM | POA: Diagnosis not present

## 2019-06-11 NOTE — Therapy (Signed)
[place], [place], [address] Phone: [phone]   Fax:  [phone]  Physical Therapy Treatment  Patient Details  Name: Erin Rivas MRN: [place]:[id number] Date of Birth: Apr 30, 1960 Referring Provider (PT): Karlton Lemon, MD   Encounter Date: 06/11/2019  PT End of Session - 06/11/19 1624    Visit Number  2    Number of Visits  12    Date for PT Re-Evaluation  07/19/19    PT Start Time  1520    PT Stop Time  1600    PT Time Calculation (min)  40 min    Activity Tolerance  Patient tolerated treatment well    Behavior During Therapy  [facility] for tasks assessed/performed       Past Medical History:  Diagnosis Date  . ASD (atrial septal defect)    a. 03/2018 s/p closure w/ 39mm Amplatzer cribriform device and 74mm Amplatzer PFO occluder.  Marland Kitchen Heart murmur   . Hyperlipidemia   . Hypothyroidism   . Migraine    hx; would not occur without exertion; "none in years" (03/31/2018)  . Obesity   . Sinus bradycardia   . Trace mitral regurgitation by prior echocardiogram 01/31/2006  . Trace tricuspid regurgitation by prior echocardiogram 01/31/2006    Past Surgical History:  Procedure Laterality Date  . ASD REPAIR  03/31/2018  . ATRIAL SEPTAL DEFECT(ASD) CLOSURE N/A 03/31/2018   Procedure: ATRIAL SEPTAL DEFECT (ASD) CLOSURE;  Surgeon: Sherren Mocha, MD;  Location: [facility];  Service: Cardiovascular;  Laterality: N/A;  . DOPPLER ECHOCARDIOGRAPHY  01/31/2006  . ELECTROCARDIOGRAM  05/2008  . RIGHT HEART CATH N/A 12/16/2017   Procedure: RIGHT HEART CATH;  Surgeon: Belva Crome, MD;  Location: [facility];  Service: Cardiovascular;  Laterality: N/A;  . TEE WITHOUT CARDIOVERSION N/A 11/28/2013   Procedure: TRANSESOPHAGEAL ECHOCARDIOGRAM (TEE);  Surgeon: Dorothy Spark, MD;  Location: [facility];  Service: Cardiovascular;  Laterality: N/A;  . TEE WITHOUT CARDIOVERSION N/A 12/16/2017   Procedure: TRANSESOPHAGEAL ECHOCARDIOGRAM (TEE);  Surgeon: Larey Dresser, MD;  Location: [facility];  Service: Cardiovascular;  Laterality: N/A;    There were no vitals filed for this visit.  Subjective Assessment - 06/11/19 1521    Subjective  Pt reports she had relief from ionto patch, until she had to take it off.   Exercises make her Lt shoulder sore.    Patient Stated Goals  Be able to sleep through the night, be pain free    Currently in Pain?  Yes    Pain Score  2     Pain Location  Shoulder    Pain Orientation  Left;Anterior    Pain Descriptors / Indicators  Aching    Pain Onset  More than a month ago    Aggravating Factors   reaching overhead, driving, sleeping    Pain Relieving Factors  aleve, heat         [facility] PT Assessment - 06/11/19 0001      Assessment   Medical Diagnosis  M25.512 (ICD-10-CM) - Left shoulder pain, unspecified chronicity    Referring Provider (PT)  Karlton Lemon, MD    Onset Date/Surgical Date  --   01/2019   Hand Dominance  Right    Next MD Visit  PRN    Prior Therapy  none       [facility] Adult PT Treatment/Exercise - 06/11/19 0001      Shoulder Exercises: Supine   Horizontal  ABduction  AAROM;Left;5 reps   cane   Horizontal ABduction Limitations  not much stretch felt.     Flexion  AAROM;Both;5 reps   cane   Other Supine Exercises  snow angels x 10 reps, to tolerance      Shoulder Exercises: Seated   Other Seated Exercises  scap squeeze x 5 reps of 5 sec hold.  W's x 5 sec hold x 10 reps - cues to retract first then bring arms up (improved tolerance0      Shoulder Exercises: Standing   Extension  AAROM;Both;10 reps      Shoulder Exercises: Isometric Strengthening   Flexion  5X5"      Shoulder Exercises: Stretch   Table Stretch - Flexion  5 reps;10 seconds    Other Shoulder Stretches  bilat bicep stretch holding door x 20 sec x 2 rpes    Other Shoulder Stretches  mid level doorway stretch x 20 sec x 2       Iontophoresis    Location  left bicipital groove    Dose  1.0cc    Time  80 mA stat patch, 4 hr wear time.       Manual Therapy   Soft tissue mobilization  IASTM for Lt anterior, middle deltoid and biceps.                  PT Long Term Goals - 06/07/19 1456      PT LONG TERM GOAL #1   Title  The patient will retun demo HEP for L shoulder ROM, strengthening and stretching.    Time  6    Period  Weeks    Target Date  07/19/19      PT LONG TERM GOAL #2   Title  The patient will reduce functional limitation from 49% to < or equal to 32%.    Time  6    Period  Weeks    Target Date  07/19/19      PT LONG TERM GOAL #3   Title  The patient will improve AROM for functional use of L UE to 140 degrees flexion, 150 degrees abduction and 60 degrees ER.    Baseline  seated AROM is 85 deg flexion, 120 abd, and 42 ER (arm supported at 70 degrees abduction).    Time  6    Period  Weeks    Target Date  07/19/19      PT LONG TERM GOAL #4   Title  The patient will report being able to sleep without waking from pain in the L shoulder.    Time  6    Period  Weeks    Target Date  07/19/19      PT LONG TERM GOAL #5   Title  The patient will demonstrate reaching behind her head for ADLs.    Time  6    Period  Weeks    Target Date  07/19/19            Plan - 06/11/19 1616    Clinical Impression Statement  Pt had positive response to ionto patch last visit; repeated today.  Pt has continued painful arc around 100 deg of flexion (in supine with cane).  she tolerated W's better with retraction prior to bringing her arms up in abdct/ER.  Pt progressing towards goals.    Examination-Activity Limitations  Lift;Reach Overhead;Carry    Examination-Participation Restrictions  Cleaning;Driving    Stability/Clinical Decision Making  Stable/Uncomplicated  Rehab Potential  Excellent    PT Frequency  2x / week    PT Duration  6 weeks    PT Treatment/Interventions  ADLs/Self Care Home  Management;Patient/family education;Therapeutic activities;Therapeutic exercise;Neuromuscular re-education;Electrical Stimulation;Iontophoresis 4mg /ml Dexamethasone;Moist Heat;Cryotherapy;Taping;Dry needling;Manual techniques;Passive range of motion    PT Next Visit Plan  continue progressive Lt shoulder ROM; DN if indicated;  trial elbow press/ star gazer stretch.  Add isometrics to HEP if tolerated.    PT Home Exercise Plan  Access Code: [email or web address]    Consulted and Agree with Plan of Care  Patient       Patient will benefit from skilled therapeutic intervention in order to improve the following deficits and impairments:  Increased fascial restricitons, Impaired flexibility, Decreased strength, Decreased range of motion, Postural dysfunction, Pain, Hypomobility, Decreased activity tolerance  Visit Diagnosis: Acute pain of left shoulder  Muscle weakness (generalized)  Abnormal posture     Problem List Patient Active Problem List   Diagnosis Date Noted  . Anal fissure 08/22/2012  . Special screening for malignant neoplasms, colon 08/22/2012  . ASD (atrial septal defect) 09/10/2010  . Obesity 09/10/2010  . Dyslipidemia 09/10/2010  . ROTATOR CUFF SYNDROME, RIGHT 09/23/2009   Kerin Perna, PTA 06/11/19 4:34 PM  [place], [place], [address] Phone: [phone]   Fax:  [phone]  Name: SHERRONDA WADDELL MRN: [place]:[id number] Date of Birth: 01/06/1961

## 2019-06-15 ENCOUNTER — Encounter: Payer: Self-pay | Admitting: Rehabilitative and Restorative Service Providers"

## 2019-06-15 ENCOUNTER — Ambulatory Visit (INDEPENDENT_AMBULATORY_CARE_PROVIDER_SITE_OTHER): Payer: 59 | Admitting: Rehabilitative and Restorative Service Providers"

## 2019-06-15 ENCOUNTER — Other Ambulatory Visit: Payer: Self-pay

## 2019-06-15 DIAGNOSIS — M6281 Muscle weakness (generalized): Secondary | ICD-10-CM | POA: Diagnosis not present

## 2019-06-15 DIAGNOSIS — R293 Abnormal posture: Secondary | ICD-10-CM

## 2019-06-15 DIAGNOSIS — M25512 Pain in left shoulder: Secondary | ICD-10-CM

## 2019-06-15 NOTE — Patient Instructions (Signed)
Scapula Adduction With Pectoralis Stretch: Low - Standing   Shoulders at 45 hands even with shoulders, keeping weight through legs, shift weight forward until you feel pull or stretch through the front of your chest. Hold _30__ seconds. Do _3__ times, _2-4__ times per day.   Scapula Adduction With Pectoralis Stretch: Mid-Range - Standing   Shoulders at 90 elbows even with shoulders, keeping weight through legs, shift weight forward until you feel pull or strength through the front of your chest. Hold __30_ seconds. Do _3__ times, __2-4_ times per day.   Scapula Adduction With Pectoralis Stretch: High - Standing   Shoulders at 120 hands up high on the doorway, keeping weight on feet, shift weight forward until you feel pull or stretch through the front of your chest. Hold _30__ seconds. Do _3__ times, _2-3__ times per day.   Scapular Retraction (Standing)    With arms at sides, pinch shoulder blades down and back  Hold 10 sec Repeat __10__ times per set. Do _several sessions per day. Can use swim noodle along spine    Trigger Point Dry Needling  . What is Trigger Point Dry Needling (DN)? o DN is a physical therapy technique used to treat muscle pain and dysfunction. Specifically, DN helps deactivate muscle trigger points (muscle knots).  o A thin filiform needle is used to penetrate the skin and stimulate the underlying trigger point. The goal is for a local twitch response (LTR) to occur and for the trigger point to relax. No medication of any kind is injected during the procedure.   . What Does Trigger Point Dry Needling Feel Like?  o The procedure feels different for each individual patient. Some patients report that they do not actually feel the needle enter the skin and overall the process is not painful. Very mild bleeding may occur. However, many patients feel a deep cramping in the muscle in which the needle was inserted. This is the local twitch response.   Marland Kitchen How Will  I feel after the treatment? o Soreness is normal, and the onset of soreness may not occur for a few hours. Typically this soreness does not last longer than two days.  o Bruising is uncommon, however; ice can be used to decrease any possible bruising.  o In rare cases feeling tired or nauseous after the treatment is normal. In addition, your symptoms may get worse before they get better, this period will typically not last longer than 24 hours.   . What Can I do After My Treatment? o Increase your hydration by drinking more water for the next 24 hours. o You may place ice or heat on the areas treated that have become sore, however, do not use heat on inflamed or bruised areas. Heat often brings more relief post needling. o You can continue your regular activities, but vigorous activity is not recommended initially after the treatment for 24 hours. o DN is best combined with other physical therapy such as strengthening, stretching, and other therapies.

## 2019-06-15 NOTE — Therapy (Signed)
[place], [place], [address] Phone: [phone]   Fax:  [phone]  Physical Therapy Treatment  Patient Details  Name: Erin Rivas MRN: [id number] Date of Birth: [birth year] Referring Provider (PT): Karlton Lemon, MD   Encounter Date: 06/15/2019    Past Medical History:  Diagnosis Date  . ASD (atrial septal defect)    a. 03/2018 s/p closure w/ 49mm Amplatzer cribriform device and 83mm Amplatzer PFO occluder.  Marland Kitchen Heart murmur   . Hyperlipidemia   . Hypothyroidism   . Migraine    hx; would not occur without exertion; "none in years" (03/31/2018)  . Obesity   . Sinus bradycardia   . Trace mitral regurgitation by prior echocardiogram 01/31/2006  . Trace tricuspid regurgitation by prior echocardiogram 01/31/2006    Past Surgical History:  Procedure Laterality Date  . ASD REPAIR  03/31/2018  . ATRIAL SEPTAL DEFECT(ASD) CLOSURE N/A 03/31/2018   Procedure: ATRIAL SEPTAL DEFECT (ASD) CLOSURE;  Surgeon: Sherren Mocha, MD;  Location: [place] [facility];  Service: Cardiovascular;  Laterality: N/A;  . DOPPLER ECHOCARDIOGRAPHY  01/31/2006  . ELECTROCARDIOGRAM  05/2008  . RIGHT HEART CATH N/A 12/16/2017   Procedure: RIGHT HEART CATH;  Surgeon: Belva Crome, MD;  Location: [place] [facility];  Service: Cardiovascular;  Laterality: N/A;  . TEE WITHOUT CARDIOVERSION N/A 11/28/2013   Procedure: TRANSESOPHAGEAL ECHOCARDIOGRAM (TEE);  Surgeon: Dorothy Spark, MD;  Location: [place];  Service: Cardiovascular;  Laterality: N/A;  . TEE WITHOUT CARDIOVERSION N/A 12/16/2017   Procedure: TRANSESOPHAGEAL ECHOCARDIOGRAM (TEE);  Surgeon: Larey Dresser, MD;  Location: [facility];  Service: Cardiovascular;  Laterality: N/A;    There were no vitals filed for this visit.  Subjective Assessment - 06/15/19 0859    Subjective  Improving - some increased soreness this morning maybe from the way she slept     Currently in Pain?  Yes    Pain Score  3     Pain Location  Shoulder    Pain Orientation  Left;Anterior    Pain Descriptors / Indicators  Aching    Pain Type  Acute pain                       [facility] Adult PT Treatment/Exercise - 06/15/19 0001      Self-Care   Self-Care  --   education re- use of noodle for sitting postural correction      Neuro Re-ed    Neuro Re-ed Details   working on posture and alignment in standing to increase thoracic extension       Shoulder Exercises: Supine   Other Supine Exercises  prolonged snow angel holding 1-2 min x 2 bending elbow to release stretch as needed       Shoulder Exercises: Standing   Other Standing Exercises  scap squeeze with noodle 10 sec x 10       Shoulder Exercises: Pulleys   Flexion  --   10 sec hold x 10 reps      Shoulder Exercises: Stretch   Other Shoulder Stretches  biceps stretch at table Lt 20-30 sec hold x 2 reps     Other Shoulder Stretches  3 position doorway stretch to pt tolerance 20-30 sec hold x 2 reps each position       Iontophoresis   Location  left bicipital groove    Dose  120 mAmp dose/1 cc     Time  8  hour wear time       Manual Therapy   Manual Therapy  --   pt supine    Manual therapy comments  skilled palpation of tissue for DN     Soft tissue mobilization  deep tissue work through the Lt shoulder girdle including pecs; anterior deltoid; biceps; upper trap; leveator     Myofascial Release  anterior chest/lt anterior shoulder        Trigger Point Dry Needling - 06/15/19 0001    Consent Given?  Yes    Education Handout Provided  Yes    Dry Needling Comments  supine Lt only     Pectoralis Major Response  Palpable increased muscle length    Pectoralis Minor Response  Palpable increased muscle length;Twitch response elicited    Deltoid Response  Palpable increased muscle length    Biceps Response  Palpable increased muscle length           PT Education - 06/15/19 0909     Education Details  HEP DN    Person(s) Educated  Patient    Methods  Explanation;Demonstration;Tactile cues;Verbal cues;Handout    Comprehension  Verbalized understanding;Returned demonstration;Verbal cues required;Tactile cues required          PT Long Term Goals - 06/07/19 1456      PT LONG TERM GOAL #1   Title  The patient will retun demo HEP for L shoulder ROM, strengthening and stretching.    Time  6    Period  Weeks    Target Date  07/19/19      PT LONG TERM GOAL #2   Title  The patient will reduce functional limitation from 49% to < or equal to 32%.    Time  6    Period  Weeks    Target Date  07/19/19      PT LONG TERM GOAL #3   Title  The patient will improve AROM for functional use of L UE to 140 degrees flexion, 150 degrees abduction and 60 degrees ER.    Baseline  seated AROM is 85 deg flexion, 120 abd, and 42 ER (arm supported at 70 degrees abduction).    Time  6    Period  Weeks    Target Date  07/19/19      PT LONG TERM GOAL #4   Title  The patient will report being able to sleep without waking from pain in the L shoulder.    Time  6    Period  Weeks    Target Date  07/19/19      PT LONG TERM GOAL #5   Title  The patient will demonstrate reaching behind her head for ADLs.    Time  6    Period  Weeks    Target Date  07/19/19            Plan - 06/15/19 1245    Clinical Impression Statement  Significant tightness noted through the anterior chest/pecs. Positive response to trial of DN and manual work with decreased palpable tightness noted with treatment. Continued exercise instruction. Patient will benefit form continued treatment to address problems identified.    Rehab Potential  Excellent    PT Frequency  2x / week    PT Duration  6 weeks    PT Treatment/Interventions  ADLs/Self Care Home Management;Patient/family education;Therapeutic activities;Therapeutic exercise;Neuromuscular re-education;Electrical Stimulation;Iontophoresis 4mg /ml  Dexamethasone;Moist Heat;Cryotherapy;Taping;Dry needling;Manual techniques;Passive range of motion    PT Next Visit Plan  continue progressive Lt shoulder  ROM; assess response to DN;  trial elbow press/ star gazer stretch.  Work to restore muscular and postural balance through upper body - stretch pecs; increase thoracic mobility (mobs?); postural education    PT Home Exercise Plan  Access Code: [email or web address] and [email or web address]    Consulted and Agree with Plan of Care  Patient       Patient will benefit from skilled therapeutic intervention in order to improve the following deficits and impairments:     Visit Diagnosis: Acute pain of left shoulder  Muscle weakness (generalized)  Abnormal posture     Problem List Patient Active Problem List   Diagnosis Date Noted  . Anal fissure 08/22/2012  . Special screening for malignant neoplasms, colon 08/22/2012  . ASD (atrial septal defect) 09/10/2010  . Obesity 09/10/2010  . Dyslipidemia 09/10/2010  . ROTATOR CUFF SYNDROME, RIGHT 09/23/2009    Rheanna Sergent Nilda Simmer PT, MPH  06/15/2019, 12:51 PM  [facility] [place], [place], [address] Phone: [phone]   Fax:  [phone]  Name: Erin Rivas MRN: [id number] Date of Birth: [birth year]

## 2019-06-20 ENCOUNTER — Other Ambulatory Visit: Payer: Self-pay

## 2019-06-20 ENCOUNTER — Encounter: Payer: Self-pay | Admitting: Rehabilitative and Restorative Service Providers"

## 2019-06-20 ENCOUNTER — Ambulatory Visit (INDEPENDENT_AMBULATORY_CARE_PROVIDER_SITE_OTHER): Payer: 59 | Admitting: Rehabilitative and Restorative Service Providers"

## 2019-06-20 DIAGNOSIS — R293 Abnormal posture: Secondary | ICD-10-CM | POA: Diagnosis not present

## 2019-06-20 DIAGNOSIS — M25512 Pain in left shoulder: Secondary | ICD-10-CM | POA: Diagnosis not present

## 2019-06-20 DIAGNOSIS — M6281 Muscle weakness (generalized): Secondary | ICD-10-CM | POA: Diagnosis not present

## 2019-06-20 NOTE — Patient Instructions (Addendum)
Shoulder Internal Rotation    Standing, feet shoulder width apart, grasp dog leash or towel with one hand palm forward, arm extended, and other hand palm back behind back, arm bent elbow down. Pull gently upward. Hold __20-30__ seconds.  Repeat _3___ times. Do _2 ___ sessions per day.   Ball on wall - shoulder flexion

## 2019-06-20 NOTE — Therapy (Signed)
[place], [place], [address] Phone: [phone]   Fax:  [phone]  Physical Therapy Treatment  Patient Details  Name: Erin Rivas MRN: [id number] Date of Birth: 11/05/1960 Referring Provider (PT): Karlton Lemon, MD   Encounter Date: 06/20/2019  PT End of Session - 06/20/19 1602    Visit Number  4    Number of Visits  12    Date for PT Re-Evaluation  07/19/19    PT Start Time  [id number]    PT Stop Time  1445    PT Time Calculation (min)  48 min    Activity Tolerance  Patient tolerated treatment well       Past Medical History:  Diagnosis Date  . ASD (atrial septal defect)    a. 03/2018 s/p closure w/ 75mm Amplatzer cribriform device and 57mm Amplatzer PFO occluder.  Marland Kitchen Heart murmur   . Hyperlipidemia   . Hypothyroidism   . Migraine    hx; would not occur without exertion; "none in years" (03/31/2018)  . Obesity   . Sinus bradycardia   . Trace mitral regurgitation by prior echocardiogram 01/31/2006  . Trace tricuspid regurgitation by prior echocardiogram 01/31/2006    Past Surgical History:  Procedure Laterality Date  . ASD REPAIR  03/31/2018  . ATRIAL SEPTAL DEFECT(ASD) CLOSURE N/A 03/31/2018   Procedure: ATRIAL SEPTAL DEFECT (ASD) CLOSURE;  Surgeon: Sherren Mocha, MD;  Location: [place] [facility];  Service: Cardiovascular;  Laterality: N/A;  . DOPPLER ECHOCARDIOGRAPHY  01/31/2006  . ELECTROCARDIOGRAM  05/2008  . RIGHT HEART CATH N/A 12/16/2017   Procedure: RIGHT HEART CATH;  Surgeon: Belva Crome, MD;  Location: [place] [facility];  Service: Cardiovascular;  Laterality: N/A;  . TEE WITHOUT CARDIOVERSION N/A 11/28/2013   Procedure: TRANSESOPHAGEAL ECHOCARDIOGRAM (TEE);  Surgeon: Dorothy Spark, MD;  Location: [place];  Service: Cardiovascular;  Laterality: N/A;  . TEE WITHOUT CARDIOVERSION N/A 12/16/2017   Procedure: TRANSESOPHAGEAL ECHOCARDIOGRAM (TEE);  Surgeon: Larey Dresser, MD;  Location: [facility] ENDOSCOPY;  Service: Cardiovascular;  Laterality: N/A;    There were no vitals filed for this visit.  Subjective Assessment - 06/20/19 1603    Subjective  Improving. Can get comfortable more easily at night. ROM is improving.    Currently in Pain?  Yes    Pain Score  2     Pain Location  Shoulder    Pain Orientation  Left;Anterior    Pain Descriptors / Indicators  Aching;Nagging    Pain Type  Acute pain    Pain Onset  More than a month ago    Pain Frequency  Intermittent    Aggravating Factors   reaching overhead: driving; sleeping    Pain Relieving Factors  aleve; heat         [facility] PT Assessment - 06/20/19 0001      Assessment   Medical Diagnosis  M25.512 (ICD-10-CM) - Left shoulder pain, unspecified chronicity    Referring Provider (PT)  Karlton Lemon, MD    Onset Date/Surgical Date  --   01/2019   Hand Dominance  Right    Next MD Visit  PRN    Prior Therapy  none      AROM   Left Shoulder Extension  42 Degrees    Left Shoulder Flexion  126 Degrees   pain with lowering    Left Shoulder ABduction  150 Degrees   pain with lowering    Left Shoulder Internal  Rotation  --   thumb to T8   Left Shoulder External Rotation  65 Degrees   in neutral elbow 90 deg flex                   [facility] Adult PT Treatment/Exercise - 06/20/19 0001      Shoulder Exercises: Pulleys   Flexion  --   10 sec hold x 10 reps    Scaption  --   10 sec hold x 10      Shoulder Exercises: Therapy Ball   Flexion  Both;5 reps    Flexion Limitations  rolling ball on wall to work on overhead reach/flexion       Shoulder Exercises: Manufacturing systems engineer Stretch  3 reps   20 sec with strap standing    Other Shoulder Stretches  biceps stretch at table Lt 20-30 sec hold x 2 reps     Other Shoulder Stretches  3 position doorway stretch to pt tolerance 20-30 sec hold x 2 reps each position       Manual Therapy   Manual Therapy  --   pt supine     Manual therapy comments  skilled palpation of tissue for DN     Joint Mobilization  thoracic PA mobs Grade II/III pt sitting head supported forward on table    Soft tissue mobilization  deep tissue work through the Lt shoulder girdle including pecs; anterior deltoid; biceps; upper trap; leveator - also worked through the posterior shoulder girdle Lt through thoracic paraspinals; leveator and upper traps     Myofascial Release  anterior chest/lt anterior shoulder     Scapular Mobilization  Lt     Passive ROM  Lt shoulder flexion; scaption ER IR        Trigger Point Dry Needling - 06/20/19 0001    Consent Given?  Yes    Education Handout Provided  Previously provided    Dry Needling Comments  supine and sitting  Lt only     Other Dry Needling  thoracic paraspinals - palpable increase in tissue extensibility     Upper Trapezius Response  Palpable increased muscle length    Levator Scapulae Response  Palpable increased muscle length           PT Education - 06/20/19 1621    Education Details  HEP    Person(s) Educated  Patient    Methods  Explanation;Demonstration;Tactile cues;Verbal cues;Handout    Comprehension  Verbalized understanding;Returned demonstration;Verbal cues required          PT Long Term Goals - 06/07/19 1456      PT LONG TERM GOAL #1   Title  The patient will retun demo HEP for L shoulder ROM, strengthening and stretching.    Time  6    Period  Weeks    Target Date  07/19/19      PT LONG TERM GOAL #2   Title  The patient will reduce functional limitation from 49% to < or equal to 32%.    Time  6    Period  Weeks    Target Date  07/19/19      PT LONG TERM GOAL #3   Title  The patient will improve AROM for functional use of L UE to 140 degrees flexion, 150 degrees abduction and 60 degrees ER.    Baseline  seated AROM is 85 deg flexion, 120 abd, and 42 ER (arm supported at 70 degrees abduction).  Time  6    Period  Weeks    Target Date  07/19/19       PT LONG TERM GOAL #4   Title  The patient will report being able to sleep without waking from pain in the L shoulder.    Time  6    Period  Weeks    Target Date  07/19/19      PT LONG TERM GOAL #5   Title  The patient will demonstrate reaching behind her head for ADLs.    Time  6    Period  Weeks    Target Date  07/19/19            Plan - 06/20/19 1659    Clinical Impression Statement  Progressing well with therapy and HEP - less palpable tightness through the anterior chest; improved AROM Lt shoulder in all planes with decreased pain reported. Added exercise without difficulty.    Rehab Potential  Excellent    PT Frequency  2x / week    PT Duration  6 weeks    PT Treatment/Interventions  ADLs/Self Care Home Management;Patient/family education;Therapeutic activities;Therapeutic exercise;Neuromuscular re-education;Electrical Stimulation;Iontophoresis 4mg /ml Dexamethasone;Moist Heat;Cryotherapy;Taping;Dry needling;Manual techniques;Passive range of motion    PT Next Visit Plan  continue progressive Lt shoulder ROM; DN/manual work/PROM and stretching;  trial elbow press/ star gazer stretch.  Work to restore muscular and postural balance through upper body - stretch pecs; assess response to thoracic mobs; postural education    PT Home Exercise Plan  Access Code: [email or web address] and VHI    Consulted and Agree with Plan of Care  Patient       Patient will benefit from skilled therapeutic intervention in order to improve the following deficits and impairments:     Visit Diagnosis: Acute pain of left shoulder  Muscle weakness (generalized)  Abnormal posture     Problem List Patient Active Problem List   Diagnosis Date Noted  . Anal fissure 08/22/2012  . Special screening for malignant neoplasms, colon 08/22/2012  . ASD (atrial septal defect) 09/10/2010  . Obesity 09/10/2010  . Dyslipidemia 09/10/2010  . ROTATOR CUFF SYNDROME, RIGHT 09/23/2009    Orelia Brandstetter Nilda Simmer PT, MPH   06/20/2019, 5:02 PM  [facility] [place], [place], [address] Phone: [phone]   Fax:  [phone]  Name: Erin Rivas MRN: [id number] Date of Birth: 22-May-1960

## 2019-06-22 ENCOUNTER — Encounter: Payer: Self-pay | Admitting: Rehabilitative and Restorative Service Providers"

## 2019-06-22 ENCOUNTER — Other Ambulatory Visit: Payer: Self-pay

## 2019-06-22 ENCOUNTER — Ambulatory Visit (INDEPENDENT_AMBULATORY_CARE_PROVIDER_SITE_OTHER): Payer: 59 | Admitting: Rehabilitative and Restorative Service Providers"

## 2019-06-22 DIAGNOSIS — M25512 Pain in left shoulder: Secondary | ICD-10-CM

## 2019-06-22 DIAGNOSIS — M6281 Muscle weakness (generalized): Secondary | ICD-10-CM | POA: Diagnosis not present

## 2019-06-22 DIAGNOSIS — R293 Abnormal posture: Secondary | ICD-10-CM

## 2019-06-22 NOTE — Therapy (Signed)
[place], [place], [address] Phone: [phone]   Fax:  [phone]  Physical Therapy Treatment  Patient Details  Name: Erin Rivas MRN: [id number] Date of Birth: [birth year] Referring Provider (PT): Karlton Lemon, MD   Encounter Date: 06/22/2019  PT End of Session - 06/22/19 0848    Visit Number  5    Number of Visits  12    Date for PT Re-Evaluation  07/19/19    PT Start Time  0843    PT Stop Time  0930    PT Time Calculation (min)  47 min       Past Medical History:  Diagnosis Date  . ASD (atrial septal defect)    a. 03/2018 s/p closure w/ 69mm Amplatzer cribriform device and 9mm Amplatzer PFO occluder.  Marland Kitchen Heart murmur   . Hyperlipidemia   . Hypothyroidism   . Migraine    hx; would not occur without exertion; "none in years" (03/31/2018)  . Obesity   . Sinus bradycardia   . Trace mitral regurgitation by prior echocardiogram 01/31/2006  . Trace tricuspid regurgitation by prior echocardiogram 01/31/2006    Past Surgical History:  Procedure Laterality Date  . ASD REPAIR  03/31/2018  . ATRIAL SEPTAL DEFECT(ASD) CLOSURE N/A 03/31/2018   Procedure: ATRIAL SEPTAL DEFECT (ASD) CLOSURE;  Surgeon: Sherren Mocha, MD;  Location: [place] [facility];  Service: Cardiovascular;  Laterality: N/A;  . DOPPLER ECHOCARDIOGRAPHY  01/31/2006  . ELECTROCARDIOGRAM  05/2008  . RIGHT HEART CATH N/A 12/16/2017   Procedure: RIGHT HEART CATH;  Surgeon: Belva Crome, MD;  Location: [place] [facility];  Service: Cardiovascular;  Laterality: N/A;  . TEE WITHOUT CARDIOVERSION N/A 11/28/2013   Procedure: TRANSESOPHAGEAL ECHOCARDIOGRAM (TEE);  Surgeon: Dorothy Spark, MD;  Location: [place];  Service: Cardiovascular;  Laterality: N/A;  . TEE WITHOUT CARDIOVERSION N/A 12/16/2017   Procedure: TRANSESOPHAGEAL ECHOCARDIOGRAM (TEE);  Surgeon: Larey Dresser, MD;  Location: [facility] ENDOSCOPY;  Service:  Cardiovascular;  Laterality: N/A;    There were no vitals filed for this visit.  Subjective Assessment - 06/22/19 0848    Subjective  Continues to improve. Slept on the Lt side some last night so she is achy this morning but pleased with her progress.    Currently in Pain?  Yes    Pain Score  2     Pain Location  Shoulder    Pain Orientation  Left;Anterior    Pain Descriptors / Indicators  Aching    Pain Type  Acute pain    Pain Onset  More than a month ago    Pain Frequency  Intermittent                       [facility] Adult PT Treatment/Exercise - 06/22/19 0001      Shoulder Exercises: Supine   Other Supine Exercises  prolonged snow angel with yoga egg T spine holding 1-2 min x 2 bending elbow to release stretch as needed       Shoulder Exercises: Pulleys   Flexion  --   10 sec hold x 10 reps    Scaption  --   10 sec hold x 10      Shoulder Exercises: Stretch   Star Gazer Stretch  --   T at wall 20 sec x 2 reps    Other Shoulder Stretches  biceps stretch at table Lt 20-30 sec hold x 2 reps  Other Shoulder Stretches  3 position doorway stretch to pt tolerance 20-30 sec hold x 2 reps each position       Iontophoresis   Type of Iontophoresis  Dexamethasone    Location  left bicipital groove    Dose  120 mAmp dose/1 cc     Time  8-10 hour wear time       Manual Therapy   Manual Therapy  --   pt supine    Manual therapy comments  skilled palpation of tissue for DN     Soft tissue mobilization  deep tissue work through the Lt anterior shoulder girdle - pecs; anterior deltoid; biceps     Myofascial Release  anterior chest/lt anterior shoulder        Trigger Point Dry Needling - 06/22/19 0001    Consent Given?  Yes    Education Handout Provided  Previously provided    Dry Needling Comments  pt supine     Pectoralis Major Response  Palpable increased muscle length    Pectoralis Minor Response  Palpable increased muscle length;Twitch response elicited     Deltoid Response  Palpable increased muscle length    Biceps Response  Palpable increased muscle length                PT Long Term Goals - 06/07/19 1456      PT LONG TERM GOAL #1   Title  The patient will retun demo HEP for L shoulder ROM, strengthening and stretching.    Time  6    Period  Weeks    Target Date  07/19/19      PT LONG TERM GOAL #2   Title  The patient will reduce functional limitation from 49% to < or equal to 32%.    Time  6    Period  Weeks    Target Date  07/19/19      PT LONG TERM GOAL #3   Title  The patient will improve AROM for functional use of L UE to 140 degrees flexion, 150 degrees abduction and 60 degrees ER.    Baseline  seated AROM is 85 deg flexion, 120 abd, and 42 ER (arm supported at 70 degrees abduction).    Time  6    Period  Weeks    Target Date  07/19/19      PT LONG TERM GOAL #4   Title  The patient will report being able to sleep without waking from pain in the L shoulder.    Time  6    Period  Weeks    Target Date  07/19/19      PT LONG TERM GOAL #5   Title  The patient will demonstrate reaching behind her head for ADLs.    Time  6    Period  Weeks    Target Date  07/19/19            Plan - 06/22/19 0854    Clinical Impression Statement  Continued progress with decreased soreness and pain and improved mobility. Note greater range with stretches. Less palpable tightness. Progressing well toward goals of treatment.    Rehab Potential  Excellent    PT Frequency  2x / week    PT Duration  6 weeks    PT Treatment/Interventions  ADLs/Self Care Home Management;Patient/family education;Therapeutic activities;Therapeutic exercise;Neuromuscular re-education;Electrical Stimulation;Iontophoresis 4mg /ml Dexamethasone;Moist Heat;Cryotherapy;Taping;Dry needling;Manual techniques;Passive range of motion    PT Next Visit Plan  continue progressive Lt shoulder ROM; DN/manual  work/PROM and stretching;  Work to restore muscular and  postural balance through upper body - stretch pecs; continue thoracic mobs; postural education    PT Home Exercise Plan  Access Code: [email or web address] and VHI       Patient will benefit from skilled therapeutic intervention in order to improve the following deficits and impairments:     Visit Diagnosis: Acute pain of left shoulder  Muscle weakness (generalized)  Abnormal posture     Problem List Patient Active Problem List   Diagnosis Date Noted  . Anal fissure 08/22/2012  . Special screening for malignant neoplasms, colon 08/22/2012  . ASD (atrial septal defect) 09/10/2010  . Obesity 09/10/2010  . Dyslipidemia 09/10/2010  . ROTATOR CUFF SYNDROME, RIGHT 09/23/2009    Alecsander Hattabaugh Nilda Simmer PT, MPH  06/22/2019, 9:30 AM  [facility] [place], [place], [address] Phone: [phone]   Fax:  [phone]  Name: Erin Rivas MRN: [id number]:[id number] Date of Birth: 05-14-1960

## 2019-06-22 NOTE — Patient Instructions (Signed)
    T stretch at wall  20-30 sec hold x 3 reps  2x/day

## 2019-06-25 ENCOUNTER — Ambulatory Visit (INDEPENDENT_AMBULATORY_CARE_PROVIDER_SITE_OTHER): Payer: 59 | Admitting: Rehabilitative and Restorative Service Providers"

## 2019-06-25 ENCOUNTER — Other Ambulatory Visit: Payer: Self-pay

## 2019-06-25 ENCOUNTER — Encounter: Payer: Self-pay | Admitting: Rehabilitative and Restorative Service Providers"

## 2019-06-25 DIAGNOSIS — R293 Abnormal posture: Secondary | ICD-10-CM

## 2019-06-25 DIAGNOSIS — M6281 Muscle weakness (generalized): Secondary | ICD-10-CM | POA: Diagnosis not present

## 2019-06-25 NOTE — Therapy (Addendum)
[place], [place], [address] Phone: [phone]   Fax:  [phone]  Physical Therapy Treatment  Patient Details  Name: Erin Rivas MRN: [id number]:[id number] Date of Birth: 1960-12-15 Referring Provider (PT): Karlton Lemon, MD   Encounter Date: 06/25/2019  PT End of Session - 06/25/19 1345    Visit Number  6    Number of Visits  12    Date for PT Re-Evaluation  07/19/19    PT Start Time  [id number]    PT Stop Time  1434    PT Time Calculation (min)  49 min    Activity Tolerance  Patient tolerated treatment well       Past Medical History:  Diagnosis Date  . ASD (atrial septal defect)    a. 03/2018 s/p closure w/ 29mm Amplatzer cribriform device and 3mm Amplatzer PFO occluder.  Marland Kitchen Heart murmur   . Hyperlipidemia   . Hypothyroidism   . Migraine    hx; would not occur without exertion; "none in years" (03/31/2018)  . Obesity   . Sinus bradycardia   . Trace mitral regurgitation by prior echocardiogram 01/31/2006  . Trace tricuspid regurgitation by prior echocardiogram 01/31/2006    Past Surgical History:  Procedure Laterality Date  . ASD REPAIR  03/31/2018  . ATRIAL SEPTAL DEFECT(ASD) CLOSURE N/A 03/31/2018   Procedure: ATRIAL SEPTAL DEFECT (ASD) CLOSURE;  Surgeon: Sherren Mocha, MD;  Location: [facility];  Service: Cardiovascular;  Laterality: N/A;  . DOPPLER ECHOCARDIOGRAPHY  01/31/2006  . ELECTROCARDIOGRAM  05/2008  . RIGHT HEART CATH N/A 12/16/2017   Procedure: RIGHT HEART CATH;  Surgeon: Belva Crome, MD;  Location: [place];  Service: Cardiovascular;  Laterality: N/A;  . TEE WITHOUT CARDIOVERSION N/A 11/28/2013   Procedure: TRANSESOPHAGEAL ECHOCARDIOGRAM (TEE);  Surgeon: Dorothy Spark, MD;  Location: [facility];  Service: Cardiovascular;  Laterality: N/A;  . TEE WITHOUT CARDIOVERSION N/A 12/16/2017   Procedure: TRANSESOPHAGEAL ECHOCARDIOGRAM (TEE);  Surgeon: Larey Dresser, MD;  Location: [facility];  Service: Cardiovascular;  Laterality: N/A;    There were no vitals filed for this visit.  Subjective Assessment - 06/25/19 1346    Subjective  Shoulder is feeling better. She had some paiin yesterday but is feeling better today. Workingon her exercises at home.    Currently in Pain?  Yes    Pain Score  1     Pain Location  Shoulder    Pain Orientation  Left;Anterior    Pain Descriptors / Indicators  Aching    Pain Type  Acute pain    Pain Onset  More than a month ago    Pain Frequency  Intermittent    Aggravating Factors   reaching overhead; putting milk in the fridge    Pain Relieving Factors  aleve; heat                       [facility] Adult PT Treatment/Exercise - 06/25/19 0001      Shoulder Exercises: Standing   Extension  Strengthening;Both;10 reps    Theraband Level (Shoulder Extension)  Level 2 (Red)    Row  Strengthening;Both;10 reps;Theraband    Theraband Level (Shoulder Row)  Level 2 (Red)    Row Limitations  step nback row with each UE x 10 red TB     Retraction  Strengthening;Both;10 reps   w/ noodle   Theraband Level (Shoulder Retraction)  Level 1 (Yellow)  Shoulder Exercises: Pulleys   Flexion  --   10 sec hold x 10 reps    Scaption  --   10 sec hold x 10      Shoulder Exercises: Stretch   Star Gazer Stretch  --   T at wall 20 sec x 2 reps    Other Shoulder Stretches  biceps stretch at table Lt 20-30 sec hold x 2 reps     Other Shoulder Stretches  3 position doorway stretch to pt tolerance 20-30 sec hold x 2 reps each position       Iontophoresis   Type of Iontophoresis  Dexamethasone    Location  left bicipital groove    Dose  120 mAmp dose/1 cc     Time  8-10 hour wear time       Manual Therapy   Manual Therapy  --   pt supine    Soft tissue mobilization  deep tissue work through the Lt anterior shoulder girdle - pecs; anterior deltoid; biceps     Myofascial Release  anterior chest/lt anterior  shoulder     Scapular Mobilization  Lt     Passive ROM  Lt shoulder flexion; scaption; ER; IR; shoulder extension               PT Education - 06/25/19 1354    Education Details  HEP    Person(s) Educated  Patient    Methods  Explanation;Demonstration;Tactile cues;Verbal cues;Handout    Comprehension  Verbalized understanding;Returned demonstration;Verbal cues required;Tactile cues required          PT Long Term Goals - 06/07/19 1456      PT LONG TERM GOAL #1   Title  The patient will retun demo HEP for L shoulder ROM, strengthening and stretching.    Time  6    Period  Weeks    Target Date  07/19/19      PT LONG TERM GOAL #2   Title  The patient will reduce functional limitation from 49% to < or equal to 32%.    Time  6    Period  Weeks    Target Date  07/19/19      PT LONG TERM GOAL #3   Title  The patient will improve AROM for functional use of L UE to 140 degrees flexion, 150 degrees abduction and 60 degrees ER.    Baseline  seated AROM is 85 deg flexion, 120 abd, and 42 ER (arm supported at 70 degrees abduction).    Time  6    Period  Weeks    Target Date  07/19/19      PT LONG TERM GOAL #4   Title  The patient will report being able to sleep without waking from pain in the L shoulder.    Time  6    Period  Weeks    Target Date  07/19/19      PT LONG TERM GOAL #5   Title  The patient will demonstrate reaching behind her head for ADLs.    Time  6    Period  Weeks    Target Date  07/19/19            Plan - 06/25/19 1407    Clinical Impression Statement  Progressing well with patient reporting 70-75% improvement since beginning therapy. Added posterior shoulder girdle strengthening without difficulty. Improving mobility with doorway pec stretching. Progressing well with rehab.    Rehab Potential  Excellent  PT Frequency  2x / week    PT Duration  6 weeks    PT Treatment/Interventions  ADLs/Self Care Home Management;Patient/family  education;Therapeutic activities;Therapeutic exercise;Neuromuscular re-education;Electrical Stimulation;Iontophoresis 4mg /ml Dexamethasone;Moist Heat;Cryotherapy;Taping;Dry needling;Manual techniques;Passive range of motion    PT Next Visit Plan  continue progressive Lt shoulder ROM; DN/manual work/PROM and stretching;  Work to restore muscular and postural balance through upper body - stretch pecs; continue thoracic mobs; postural education - progress with posterior shoulder girdle strengthening    PT Home Exercise Plan  Access Code: [email or web address] and [email or web address]    Consulted and Agree with Plan of Care  Patient       Patient will benefit from skilled therapeutic intervention in order to improve the following deficits and impairments:     Visit Diagnosis: Muscle weakness (generalized)  Abnormal posture     Problem List Patient Active Problem List   Diagnosis Date Noted  . Anal fissure 08/22/2012  . Special screening for malignant neoplasms, colon 08/22/2012  . ASD (atrial septal defect) 09/10/2010  . Obesity 09/10/2010  . Dyslipidemia 09/10/2010  . ROTATOR CUFF SYNDROME, RIGHT 09/23/2009    Landa Mullinax Nilda Simmer PT, MPH  06/25/2019, 2:38 PM  [facility] [place], [place], [address] Phone: [phone]   Fax:  [phone]  Name: Erin Rivas MRN: [id number] Date of Birth: 1960/09/23

## 2019-06-25 NOTE — Patient Instructions (Signed)
Resisted External Rotation: in Neutral - Bilateral   PALMS UP Sit or stand, tubing in both hands, elbows at sides, bent to 90, forearms forward. Pinch shoulder blades together and rotate forearms out. Keep elbows at sides. Repeat __10__ times per set. Do _2-3___ sets per session. Do _2-3___ sessions per day.   Low Row: Standing   Face anchor, feet shoulder width apart. Palms up, pull arms back, squeezing shoulder blades together. Repeat 10__ times per set. Do 2-3__ sets per session. Do 2-3__ sessions per week. Anchor Height: Waist  Step back row with one arm - reps and sets as above    Strengthening: Resisted Extension   Hold tubing in right hand, arm forward. Pull arm back, elbow straight. Repeat _10___ times per set. Do 2-3____ sets per session. Do 2-3____ sessions per day.

## 2019-06-27 ENCOUNTER — Ambulatory Visit (INDEPENDENT_AMBULATORY_CARE_PROVIDER_SITE_OTHER): Payer: 59 | Admitting: Rehabilitative and Restorative Service Providers"

## 2019-06-27 ENCOUNTER — Encounter: Payer: Self-pay | Admitting: Rehabilitative and Restorative Service Providers"

## 2019-06-27 ENCOUNTER — Other Ambulatory Visit: Payer: Self-pay

## 2019-06-27 DIAGNOSIS — M6281 Muscle weakness (generalized): Secondary | ICD-10-CM | POA: Diagnosis not present

## 2019-06-27 DIAGNOSIS — R293 Abnormal posture: Secondary | ICD-10-CM

## 2019-06-27 DIAGNOSIS — M25512 Pain in left shoulder: Secondary | ICD-10-CM

## 2019-06-27 NOTE — Therapy (Signed)
[place], [place], [address] Phone: [phone]   Fax:  [phone]  Physical Therapy Treatment  Patient Details  Name: Erin Rivas MRN: [id number] Date of Birth: [birth year] Referring Provider (PT): Karlton Lemon, MD   Encounter Date: 06/27/2019  PT End of Session - 06/27/19 1347    Visit Number  7    Number of Visits  12    Date for PT Re-Evaluation  07/19/19    PT Start Time  1346    PT Stop Time  1433    PT Time Calculation (min)  47 min    Activity Tolerance  Patient tolerated treatment well       Past Medical History:  Diagnosis Date  . ASD (atrial septal defect)    a. 03/2018 s/p closure w/ 40mm Amplatzer cribriform device and 24mm Amplatzer PFO occluder.  Marland Kitchen Heart murmur   . Hyperlipidemia   . Hypothyroidism   . Migraine    hx; would not occur without exertion; "none in years" (03/31/2018)  . Obesity   . Sinus bradycardia   . Trace mitral regurgitation by prior echocardiogram 01/31/2006  . Trace tricuspid regurgitation by prior echocardiogram 01/31/2006    Past Surgical History:  Procedure Laterality Date  . ASD REPAIR  03/31/2018  . ATRIAL SEPTAL DEFECT(ASD) CLOSURE N/A 03/31/2018   Procedure: ATRIAL SEPTAL DEFECT (ASD) CLOSURE;  Surgeon: Sherren Mocha, MD;  Location: [place];  Service: Cardiovascular;  Laterality: N/A;  . DOPPLER ECHOCARDIOGRAPHY  01/31/2006  . ELECTROCARDIOGRAM  05/2008  . RIGHT HEART CATH N/A 12/16/2017   Procedure: RIGHT HEART CATH;  Surgeon: Belva Crome, MD;  Location: [place];  Service: Cardiovascular;  Laterality: N/A;  . TEE WITHOUT CARDIOVERSION N/A 11/28/2013   Procedure: TRANSESOPHAGEAL ECHOCARDIOGRAM (TEE);  Surgeon: Dorothy Spark, MD;  Location: [facility];  Service: Cardiovascular;  Laterality: N/A;  . TEE WITHOUT CARDIOVERSION N/A 12/16/2017   Procedure: TRANSESOPHAGEAL ECHOCARDIOGRAM (TEE);  Surgeon: Larey Dresser, MD;  Location: [facility]OS[facility];  Service: Cardiovascular;  Laterality: N/A;    There were no vitals filed for this visit.  Subjective Assessment - 06/27/19 1351    Subjective  Shoulder felt worse most of the day yesterday. Just achy. Better today and feels she has better motion.    Currently in Pain?  No/denies    Pain Score  1          [facility] PT Assessment - 06/27/19 0001      Assessment   Medical Diagnosis  M25.512 (ICD-10-CM) - Left shoulder pain, unspecified chronicity    Referring Provider (PT)  Karlton Lemon, MD    Onset Date/Surgical Date  --   01/2019   Hand Dominance  Right    Next MD Visit  PRN    Prior Therapy  none      AROM   Left Shoulder Extension  70 Degrees    Left Shoulder Flexion  147 Degrees   pain with lowering    Left Shoulder ABduction  150 Degrees    Left Shoulder Internal Rotation  --   thumb to T8   Left Shoulder External Rotation  70 Degrees   in neutral elbow 90 deg flex                   [facility] Adult PT Treatment/Exercise - 06/27/19 0001      Shoulder Exercises: Standing   Extension  Strengthening;Both;10 reps  Theraband Level (Shoulder Extension)  Level 2 (Red)    Row  Strengthening;Both;10 reps;Theraband    Theraband Level (Shoulder Row)  Level 2 (Red)    Row Limitations  step back row with each UE x 10 red TB     Retraction  Strengthening;Both;10 reps   w/ noodle   Theraband Level (Shoulder Retraction)  Level 2 (Red)      Shoulder Exercises: Pulleys   Flexion  --   10 sec hold x 8 reps    Scaption  --   10 sec hold x 8      Shoulder Exercises: Therapy Ball   Other Therapy Ball Exercises  ball on wall - dorsum of hand working on ER strengthening- 4 inch ball x ~ 45 - 60 sec x 5 reps       Shoulder Exercises: Stretch   Wall Stretch - Flexion Limitations  hands on top of doorway to stretch into flexion 30 sec x 3 reps     Star Gazer Stretch  --   T at wall 20 sec x 2 reps    Other Shoulder Stretches  biceps  stretch at table Lt 20-30 sec hold x 2 reps     Other Shoulder Stretches  3 position doorway stretch to pt tolerance 20-30 sec hold x 2 reps each position       Manual Therapy   Manual Therapy  Soft tissue mobilization;Passive ROM;Scapular mobilization;Myofascial release;Manual Traction   pt supine    Joint Mobilization  GH mobs Grade II/III     Soft tissue mobilization  deep tissue work through the Lt anterior shoulder girdle - pecs; anterior deltoid; biceps     Myofascial Release  anterior chest/lt anterior shoulder     Scapular Mobilization  Lt pt sidelying with UE stretch into abduction    Passive ROM  Lt shoulder flexion; scaption; ER; IR; shoulder extension      Manual Traction  through long arm Lt UE              PT Education - 06/27/19 1400    Education Details  HEP    Person(s) Educated  Patient    Methods  Explanation;Demonstration;Tactile cues;Verbal cues;Handout    Comprehension  Verbalized understanding;Returned demonstration;Verbal cues required;Tactile cues required          PT Long Term Goals - 06/07/19 1456      PT LONG TERM GOAL #1   Title  The patient will retun demo HEP for L shoulder ROM, strengthening and stretching.    Time  6    Period  Weeks    Target Date  07/19/19      PT LONG TERM GOAL #2   Title  The patient will reduce functional limitation from 49% to < or equal to 32%.    Time  6    Period  Weeks    Target Date  07/19/19      PT LONG TERM GOAL #3   Title  The patient will improve AROM for functional use of L UE to 140 degrees flexion, 150 degrees abduction and 60 degrees ER.    Baseline  seated AROM is 85 deg flexion, 120 abd, and 42 ER (arm supported at 70 degrees abduction).    Time  6    Period  Weeks    Target Date  07/19/19      PT LONG TERM GOAL #4   Title  The patient will report being able to sleep without waking  from pain in the L shoulder.    Time  6    Period  Weeks    Target Date  07/19/19      PT LONG TERM GOAL #5    Title  The patient will demonstrate reaching behind her head for ADLs.    Time  6    Period  Weeks    Target Date  07/19/19            Plan - 06/27/19 1348    Clinical Impression Statement  Not as good yesterday - achy most of the day and aching in the posterior aspect of the shoulder. Today is better and patient feels she has better ROM. Continued with shoulder rehab - including ROM; strengthening; manual work. Progressing gradually toward goals of treatment.    Rehab Potential  Excellent    PT Frequency  2x / week    PT Duration  6 weeks    PT Treatment/Interventions  ADLs/Self Care Home Management;Patient/family education;Therapeutic activities;Therapeutic exercise;Neuromuscular re-education;Electrical Stimulation;Iontophoresis 4mg /ml Dexamethasone;Moist Heat;Cryotherapy;Taping;Dry needling;Manual techniques;Passive range of motion    PT Next Visit Plan  continue progressive Lt shoulder ROM; DN/manual work/PROM and stretching;  Work to restore muscular and postural balance through upper body - stretch pecs; continue thoracic mobs; postural education - progress with posterior shoulder girdle strengthening    PT Home Exercise Plan  Access Code: [email or web address] and [email or web address]    Consulted and Agree with Plan of Care  Patient       Patient will benefit from skilled therapeutic intervention in order to improve the following deficits and impairments:     Visit Diagnosis: Muscle weakness (generalized)  Abnormal posture  Acute pain of left shoulder     Problem List Patient Active Problem List   Diagnosis Date Noted  . Anal fissure 08/22/2012  . Special screening for malignant neoplasms, colon 08/22/2012  . ASD (atrial septal defect) 09/10/2010  . Obesity 09/10/2010  . Dyslipidemia 09/10/2010  . ROTATOR CUFF SYNDROME, RIGHT 09/23/2009    Yuriana Gaal Nilda Simmer PT, MPH  06/27/2019, 2:30 PM  [facility] [place], [place],  [address] Phone: [phone]   Fax:  [phone]  Name: Erin Rivas MRN: [id number] Date of Birth: [birth year]

## 2019-06-27 NOTE — Patient Instructions (Signed)
Hands on top of doorway  Step through gently  Hold 30 secs 3 reps

## 2019-06-28 DIAGNOSIS — L900 Lichen sclerosus et atrophicus: Secondary | ICD-10-CM | POA: Diagnosis not present

## 2019-06-28 DIAGNOSIS — L438 Other lichen planus: Secondary | ICD-10-CM | POA: Diagnosis not present

## 2019-06-28 MED FILL — BETAMETHASONE DP AUG 0.05%: 0.05 | 30 days supply | Qty: 45 | Fill #0

## 2019-06-29 ENCOUNTER — Encounter: Payer: 59 | Admitting: Rehabilitative and Restorative Service Providers"

## 2019-07-02 ENCOUNTER — Other Ambulatory Visit (HOSPITAL_COMMUNITY): Payer: Self-pay | Admitting: Family Medicine

## 2019-07-09 ENCOUNTER — Encounter: Payer: Self-pay | Admitting: Rehabilitative and Restorative Service Providers"

## 2019-07-09 ENCOUNTER — Other Ambulatory Visit: Payer: Self-pay

## 2019-07-09 ENCOUNTER — Ambulatory Visit (INDEPENDENT_AMBULATORY_CARE_PROVIDER_SITE_OTHER): Payer: 59 | Admitting: Rehabilitative and Restorative Service Providers"

## 2019-07-09 DIAGNOSIS — M6281 Muscle weakness (generalized): Secondary | ICD-10-CM | POA: Diagnosis not present

## 2019-07-09 DIAGNOSIS — R293 Abnormal posture: Secondary | ICD-10-CM | POA: Diagnosis not present

## 2019-07-09 DIAGNOSIS — M25512 Pain in left shoulder: Secondary | ICD-10-CM

## 2019-07-09 NOTE — Patient Instructions (Signed)
Lat stretch supine   Lying on back bring both knees to chest  Turn hands toward face and push hands over head keeping elbows close  Hold 30-45 sec x 3 reps  Can push one hand overhead using opposite hand     Ball on wall in elevated position pressing into the ball 3 sec hold as you lower ball slowly  4-5 stops going down the wall  Can repeat with fingertips on wall - slightly lifting fingers from wall if you can do so without pain  2-3 reps

## 2019-07-09 NOTE — Therapy (Signed)
[place], [place], [address] Phone: [phone]   Fax:  [phone]  Physical Therapy Treatment  Patient Details  Name: Erin Rivas MRN: [id number] Date of Birth: 11-28-1960 Referring Provider (PT): Karlton Lemon, MD   Encounter Date: 07/09/2019  PT End of Session - 07/09/19 0851    Visit Number  8    Number of Visits  12    Date for PT Re-Evaluation  07/19/19    PT Start Time  0848    PT Stop Time  0940    PT Time Calculation (min)  52 min    Activity Tolerance  Patient tolerated treatment well       Past Medical History:  Diagnosis Date  . ASD (atrial septal defect)    a. 03/2018 s/p closure w/ 84mm Amplatzer cribriform device and 33mm Amplatzer PFO occluder.  Marland Kitchen Heart murmur   . Hyperlipidemia   . Hypothyroidism   . Migraine    hx; would not occur without exertion; "none in years" (03/31/2018)  . Obesity   . Sinus bradycardia   . Trace mitral regurgitation by prior echocardiogram 01/31/2006  . Trace tricuspid regurgitation by prior echocardiogram 01/31/2006    Past Surgical History:  Procedure Laterality Date  . ASD REPAIR  03/31/2018  . ATRIAL SEPTAL DEFECT(ASD) CLOSURE N/A 03/31/2018   Procedure: ATRIAL SEPTAL DEFECT (ASD) CLOSURE;  Surgeon: Sherren Mocha, MD;  Location: [place] [facility];  Service: Cardiovascular;  Laterality: N/A;  . DOPPLER ECHOCARDIOGRAPHY  01/31/2006  . ELECTROCARDIOGRAM  05/2008  . RIGHT HEART CATH N/A 12/16/2017   Procedure: RIGHT HEART CATH;  Surgeon: Belva Crome, MD;  Location: [place];  Service: Cardiovascular;  Laterality: N/A;  . TEE WITHOUT CARDIOVERSION N/A 11/28/2013   Procedure: TRANSESOPHAGEAL ECHOCARDIOGRAM (TEE);  Surgeon: Dorothy Spark, MD;  Location: [facility];  Service: Cardiovascular;  Laterality: N/A;  . TEE WITHOUT CARDIOVERSION N/A 12/16/2017   Procedure: TRANSESOPHAGEAL ECHOCARDIOGRAM (TEE);  Surgeon: Larey Dresser, MD;  Location: [facility] ENDOSCOPY;  Service: Cardiovascular;  Laterality: N/A;    There were no vitals filed for this visit.  Subjective Assessment - 07/09/19 0855    Subjective  Shoulder is feeling good. Did some work last week but not like she should have. Feels some discomfort with lowering arm from elevation.    Currently in Pain?  No/denies         [facility] PT Assessment - 07/09/19 0001      Assessment   Medical Diagnosis  M25.512 (ICD-10-CM) - Left shoulder pain, unspecified chronicity    Referring Provider (PT)  Karlton Lemon, MD    Onset Date/Surgical Date  --   01/2019   Hand Dominance  Right    Next MD Visit  PRN    Prior Therapy  none      Palpation   Palpation comment  tenderness and tightness noted through the anterior deltoid, bicipital groove, insertion of lat                    [facility] Adult PT Treatment/Exercise - 07/09/19 0001      Therapeutic Activites    Therapeutic Activities  --   myofacial ball release work ball on ant shoulder      Neuro Re-ed    Neuro Re-ed Details   working on posture and alignment in standing to increase thoracic extension with lowering Lt UE from flexion       Shoulder  Exercises: Pulleys   Flexion  --   10 sec hold x 8 reps    Scaption  --   10 sec hold x 8      Shoulder Exercises: Therapy Ball   Other Therapy Ball Exercises  isometric press into ball on wall in elevated posiitions ~ 90 to 130 deg flexion       Shoulder Exercises: Stretch   Internal Rotation Stretch Limitations  lat stretch supine 30 sec x 3     Wall Stretch - Flexion Limitations  hands on top of doorway to stretch into flexion 30 sec x 3 reps     Other Shoulder Stretches  biceps stretch at table Lt 20-30 sec hold x 2 reps       Moist Heat Therapy   Number Minutes Moist Heat  10 Minutes    Moist Heat Location  Shoulder   anterior Lt      Manual Therapy   Manual Therapy  Soft tissue mobilization;Passive ROM;Scapular mobilization;Myofascial  release;Manual Traction   pt supine    Manual therapy comments  skilled palpation of tissue for DN     Soft tissue mobilization  deep tissue work through the Lt anterior shoulder girdle - pecs; anterior deltoid; biceps     Myofascial Release  Lt anterior shoulder     Passive ROM  Lt shoulder flexion; scaption; ER; IR; shoulder extension      Manual Traction  through long arm Lt UE        Trigger Point Dry Needling - 07/09/19 0001    Consent Given?  Yes    Education Handout Provided  Previously provided    Dry Needling Comments  pt supine     Deltoid Response  Palpable increased muscle length   insertion of lats           PT Education - 07/09/19 0934    Education Details  HEP    Person(s) Educated  Patient    Methods  Explanation;Demonstration;Tactile cues;Verbal cues;Handout    Comprehension  Verbalized understanding;Returned demonstration;Verbal cues required;Tactile cues required          PT Long Term Goals - 06/07/19 1456      PT LONG TERM GOAL #1   Title  The patient will retun demo HEP for L shoulder ROM, strengthening and stretching.    Time  6    Period  Weeks    Target Date  07/19/19      PT LONG TERM GOAL #2   Title  The patient will reduce functional limitation from 49% to < or equal to 32%.    Time  6    Period  Weeks    Target Date  07/19/19      PT LONG TERM GOAL #3   Title  The patient will improve AROM for functional use of L UE to 140 degrees flexion, 150 degrees abduction and 60 degrees ER.    Baseline  seated AROM is 85 deg flexion, 120 abd, and 42 ER (arm supported at 70 degrees abduction).    Time  6    Period  Weeks    Target Date  07/19/19      PT LONG TERM GOAL #4   Title  The patient will report being able to sleep without waking from pain in the L shoulder.    Time  6    Period  Weeks    Target Date  07/19/19      PT LONG TERM GOAL #  5   Title  The patient will demonstrate reaching behind her head for ADLs.    Time  6    Period   Weeks    Target Date  07/19/19            Plan - 07/09/19 0858    Clinical Impression Statement  Continued steady progress with shoulder rehab. Continues to have some discomfort with lowering arm from elevated positions. Note significant tightness in the insertion of lats anterior shoulder.  Added exercise to work in this range using ball and hand on wall. Continues to benefit from PT to address remaining restrictions.    Rehab Potential  Excellent    PT Frequency  2x / week    PT Duration  6 weeks    PT Treatment/Interventions  ADLs/Self Care Home Management;Patient/family education;Therapeutic activities;Therapeutic exercise;Neuromuscular re-education;Electrical Stimulation;Iontophoresis 4mg /ml Dexamethasone;Moist Heat;Cryotherapy;Taping;Dry needling;Manual techniques;Passive range of motion    PT Next Visit Plan  continue progressive Lt shoulder ROM; DN/manual work/PROM and stretching;  Work to restore muscular and postural balance through upper body - stretch pecs; continue thoracic mobs; postural education - progress with posterior shoulder girdle strengthening    PT Home Exercise Plan  Access Code: [email or web address] and [email or web address]    Consulted and Agree with Plan of Care  Patient       Patient will benefit from skilled therapeutic intervention in order to improve the following deficits and impairments:     Visit Diagnosis: Muscle weakness (generalized)  Abnormal posture  Acute pain of left shoulder     Problem List Patient Active Problem List   Diagnosis Date Noted  . Anal fissure 08/22/2012  . Special screening for malignant neoplasms, colon 08/22/2012  . ASD (atrial septal defect) 09/10/2010  . Obesity 09/10/2010  . Dyslipidemia 09/10/2010  . ROTATOR CUFF SYNDROME, RIGHT 09/23/2009    Jelena Malicoat Nilda Simmer PT, MPH  07/09/2019, 9:38 AM  [facility] [place], [place], [address] Phone: [phone]   Fax:   [phone]  Name: Erin Rivas MRN: [id number] Date of Birth: 01/14/1961

## 2019-07-16 ENCOUNTER — Other Ambulatory Visit: Payer: Self-pay

## 2019-07-16 ENCOUNTER — Ambulatory Visit (INDEPENDENT_AMBULATORY_CARE_PROVIDER_SITE_OTHER): Payer: 59 | Admitting: Rehabilitative and Restorative Service Providers"

## 2019-07-16 ENCOUNTER — Encounter: Payer: Self-pay | Admitting: Rehabilitative and Restorative Service Providers"

## 2019-07-16 DIAGNOSIS — M6281 Muscle weakness (generalized): Secondary | ICD-10-CM

## 2019-07-16 DIAGNOSIS — R293 Abnormal posture: Secondary | ICD-10-CM

## 2019-07-16 DIAGNOSIS — M25512 Pain in left shoulder: Secondary | ICD-10-CM | POA: Diagnosis not present

## 2019-07-16 NOTE — Therapy (Addendum)
[place], [place], [address] Phone: [phone]   Fax:  [phone]  Physical Therapy Treatment  Patient Details  Name: Erin Rivas MRN: [id number] Date of Birth: 1960-08-27 Referring Provider (PT): Karlton Lemon, MD   Encounter Date: 07/16/2019  PT End of Session - 07/16/19 1348    Visit Number  9    Number of Visits  12    Date for PT Re-Evaluation  07/19/19    PT Start Time  2376    PT Stop Time  1430   moist heat end of treatment   PT Time Calculation (min)  42 min    Activity Tolerance  Patient tolerated treatment well       Past Medical History:  Diagnosis Date  . ASD (atrial septal defect)    a. 03/2018 s/p closure w/ 2m Amplatzer cribriform device and 270mAmplatzer PFO occluder.  . Marland Kitcheneart murmur   . Hyperlipidemia   . Hypothyroidism   . Migraine    hx; would not occur without exertion; "none in years" (03/31/2018)  . Obesity   . Sinus bradycardia   . Trace mitral regurgitation by prior echocardiogram 01/31/2006  . Trace tricuspid regurgitation by prior echocardiogram 01/31/2006    Past Surgical History:  Procedure Laterality Date  . ASD REPAIR  03/31/2018  . ATRIAL SEPTAL DEFECT(ASD) CLOSURE N/A 03/31/2018   Procedure: ATRIAL SEPTAL DEFECT (ASD) CLOSURE;  Surgeon: CoSherren MochaMD;  [facility];  Service: Cardiovascular;  Laterality: N/A;  . DOPPLER ECHOCARDIOGRAPHY  01/31/2006  . ELECTROCARDIOGRAM  05/2008  . RIGHT HEART CATH N/A 12/16/2017   Procedure: RIGHT HEART CATH;  Surgeon: SmBelva CromeMD;  [facility];  Service: Cardiovascular;  Laterality: N/A;  . TEE WITHOUT CARDIOVERSION N/A 11/28/2013   Procedure: TRANSESOPHAGEAL ECHOCARDIOGRAM (TEE);  Surgeon: KaDorothy SparkMD;  [facility] Service: Cardiovascular;  Laterality: N/A;  . TEE WITHOUT CARDIOVERSION N/A 12/16/2017   Procedure: TRANSESOPHAGEAL  ECHOCARDIOGRAM (TEE);  Surgeon: McLarey DresserMD;  [facility];  Service: Cardiovascular;  Laterality: N/A;    There were no vitals filed for this visit.  Subjective Assessment - 07/16/19 1350    Subjective  Shoulder is feeling good. At least 90% improved overall - confident in countinuing independent HEP and feels ready to hold PT for now. Will call if she needs additional treatment.    Currently in Pain?  No/denies         [facility] 07/16/19 0001      Assessment   Medical Diagnosis  M25.512 (ICD-10-CM) - Left shoulder pain, unspecified chronicity    Referring Provider (PT)  HuKarlton LemonMD    Onset Date/Surgical Date  --   01/2019   Hand Dominance  Right    Next MD Visit  PRN    Prior Therapy  none      Observation/Other Assessments   Focus on Therapeutic Outcomes (FOTO)   28% limitation       AROM   Overall AROM Comments  pain free AROM     Left Shoulder Extension  75 Degrees    Left Shoulder Flexion  160 Degrees    Left Shoulder ABduction  165 Degrees    Left Shoulder External Rotation  95 Degrees      PROM   Overall PROM Comments  pain free PROM     Left Shoulder Flexion  172 Degrees  Left Shoulder ABduction  170 Degrees    Left Shoulder Internal Rotation  72 Degrees    Left Shoulder External Rotation  117 Degrees      Strength   Overall Strength Comments  WFL's Lt UE       Palpation   Palpation comment  minimal tightness noted through the pecs, anterior deltoid, bicipital groove, insertion of lat                    [facility] Adult PT Treatment/Exercise - 07/16/19 0001      Exercises   Exercises  --   review of HEP      Shoulder Exercises: Stretch   Internal Rotation Stretch Limitations  lat stretch supine 30 sec x 3     Other Shoulder Stretches  biceps stretch at table Lt 20-30 sec hold x 2 reps     Other Shoulder Stretches  3 position doorway stretch to pt tolerance 20-30 sec hold x 2 reps each position        Moist Heat Therapy   Number Minutes Moist Heat  10 Minutes    Moist Heat Location  Shoulder   anterior Lt      Manual Therapy   Manual Therapy  Soft tissue mobilization;Passive ROM;Scapular mobilization;Myofascial release;Manual Traction   pt supine    Soft tissue mobilization  deep tissue work through the Lt anterior shoulder girdle - pecs; anterior deltoid; biceps     Myofascial Release  Lt anterior shoulder     Passive ROM  Lt shoulder flexion; scaption; ER; IR; shoulder extension      Manual Traction  through long arm Lt UE                   PT Long Term Goals - 07/16/19 1354      PT LONG TERM GOAL #1   Title  The patient will retun demo HEP for L shoulder ROM, strengthening and stretching.    Time  6    Period  Weeks    Status  Achieved      PT LONG TERM GOAL #2   Title  The patient will reduce functional limitation from 49% to < or equal to 32%.    Time  6    Period  Weeks    Status  Achieved      PT LONG TERM GOAL #3   Title  The patient will improve AROM for functional use of L UE to 140 degrees flexion, 150 degrees abduction and 60 degrees ER.    Baseline  -    Time  6    Period  Weeks    Status  Achieved      PT LONG TERM GOAL #4   Title  The patient will report being able to sleep without waking from pain in the L shoulder.    Time  6    Period  Weeks    Status  Achieved      PT LONG TERM GOAL #5   Title  The patient will demonstrate reaching behind her head for ADLs.    Time  6    Period  Weeks    Status  Achieved            Plan - 07/16/19 1352    Clinical Impression Statement  Progressing well with shoulder rehab. Goals of therapy accomplished and patient feels confident in continuing with independent management of shoulder rehab. She will call with any questions  or problems.    Rehab Potential  Excellent    PT Frequency  2x / week    PT Duration  6 weeks    PT Treatment/Interventions  ADLs/Self Care Home Management;Patient/family  education;Therapeutic activities;Therapeutic exercise;Neuromuscular re-education;Electrical Stimulation;Iontophoresis '4mg'$ /ml Dexamethasone;Moist Heat;Cryotherapy;Taping;Dry needling;Manual techniques;Passive range of motion    PT Next Visit Plan  patient will conintue with independent HEP - she will call with any questions or problems    PT Home Exercise Plan  Access Code: [email or web address]    Consulted and Agree with Plan of Care  Patient       Patient will benefit from skilled therapeutic intervention in order to improve the following deficits and impairments:     Visit Diagnosis: Muscle weakness (generalized)  Abnormal posture  Acute pain of left shoulder     Problem List Patient Active Problem List   Diagnosis Date Noted  . Anal fissure 08/22/2012  . Special screening for malignant neoplasms, colon 08/22/2012  . ASD (atrial septal defect) 09/10/2010  . Obesity 09/10/2010  . Dyslipidemia 09/10/2010  . ROTATOR CUFF SYNDROME, RIGHT 09/23/2009    Cleora Karnik Nilda Simmer PT, MPH  07/16/2019, 2:19 PM  [facility] [place] [employer] [place], [place], [address] Phone: [phone]   Fax:  [phone]  Name: ABAGAYLE KLUTTS MRN: [id number] Date of Birth: 02-14-1961  PHYSICAL THERAPY DISCHARGE SUMMARY  Visits from Start of Care: 9  Current functional level related to goals / functional outcomes: See progress note for discharge status    Remaining deficits: No known deficits    Education / Equipment: HEP Plan: Patient agrees to discharge.  Patient goals were met. Patient is being discharged due to meeting the stated rehab goals.  ?????    Taquita Demby P. Helene Kelp PT, MPH 08/15/19 3:27 PM

## 2019-07-23 MED FILL — SYNTHROID 112 MCG TABLET: 112 | 90 days supply | Qty: 90 | Fill #0

## 2019-07-23 MED FILL — ATORVASTATIN 20 MG TABLET: 20 | 90 days supply | Qty: 90 | Fill #0

## 2019-08-09 ENCOUNTER — Other Ambulatory Visit (HOSPITAL_COMMUNITY): Payer: Self-pay | Admitting: Dermatology

## 2019-08-09 DIAGNOSIS — L900 Lichen sclerosus et atrophicus: Secondary | ICD-10-CM | POA: Diagnosis not present

## 2019-08-09 MED FILL — TACROLIMUS 0.1 % OINT: 0.1 | 30 days supply | Qty: 30 | Fill #0

## 2019-08-10 MED FILL — BETAMETHASONE DP AUG 0.05%: 0.05 | 30 days supply | Qty: 45 | Fill #1

## 2019-10-18 MED FILL — SYNTHROID 112 MCG TABLET: 112 | 90 days supply | Qty: 90 | Fill #1

## 2019-10-18 MED FILL — ATORVASTATIN 20 MG TABLET: 20 | 90 days supply | Qty: 90 | Fill #1

## 2019-10-22 ENCOUNTER — Other Ambulatory Visit: Payer: Self-pay

## 2019-10-22 ENCOUNTER — Ambulatory Visit: Payer: 59 | Admitting: Family Medicine

## 2019-10-22 ENCOUNTER — Encounter: Payer: Self-pay | Admitting: Family Medicine

## 2019-10-22 VITALS — BP 116/78 | Ht 68.0 in | Wt 201.0 lb

## 2019-10-22 DIAGNOSIS — M722 Plantar fascial fibromatosis: Secondary | ICD-10-CM

## 2019-10-22 NOTE — Patient Instructions (Signed)
You have plantar fasciitis though you're getting some lateral pain also from walking funny - stop that. Take tylenol and/or aleve as needed for pain  Plantar fascia stretch for 20-30 seconds (do 3 of these) in morning Lowering/raise on a step exercises 3 x 10 once or twice a day - this is very important for long term recovery. Can add heel walks, toe walks forward and backward as well Ice heel for 15 minutes as needed. Avoid flat shoes/barefoot walking as much as possible. Arch straps have been shown to help with pain. Inserts are important (dr. Zoe Lan active series, spencos, our green insoles, custom orthotics). Steroid injection is a consideration for short term pain relief if you are struggling - I would get this tomorrow at the latest. Physical therapy is also an option. Follow up with me when you see me because you know where I am.

## 2019-10-22 NOTE — Progress Notes (Signed)
PCP: Donald Prose, MD  Subjective:   HPI: Patient is a 59 y.o. female here for left heel pain.  Patient reports she's had worsening left heel pain over past couple weeks. Pain plantar and lateral left heel. No swelling, bruising, acute injury. Pain worse in morning and with prolonged sitting. No increase in activity level. Planning a trip to [place] Friday which will involve a lot of walking.  Past Medical History:  Diagnosis Date  . ASD (atrial septal defect)    a. 03/2018 s/p closure w/ 48mm Amplatzer cribriform device and 62mm Amplatzer PFO occluder.  Marland Kitchen Heart murmur   . Hyperlipidemia   . Hypothyroidism   . Migraine    hx; would not occur without exertion; "none in years" (03/31/2018)  . Obesity   . Sinus bradycardia   . Trace mitral regurgitation by prior echocardiogram 01/31/2006  . Trace tricuspid regurgitation by prior echocardiogram 01/31/2006    Current Outpatient Medications on File Prior to Visit  Medication Sig Dispense Refill  . aspirin 81 MG tablet Take 81 mg by mouth daily at 12 noon.     Marland Kitchen atorvastatin (LIPITOR) 20 MG tablet Take 1 tablet (20 mg total) by mouth daily. 90 tablet 3  . augmented betamethasone dipropionate (DIPROLENE-AF) 0.05 % ointment SMARTSIG:Sparingly Topical Twice Daily    . estradiol (ESTRACE) 0.1 MG/GM vaginal cream     . SYNTHROID 112 MCG tablet Take 1 tablet (112 mcg total) by mouth daily. 90 tablet 3  . tacrolimus (PROTOPIC) 0.1 % ointment SMARTSIG:Sparingly Topical Twice Daily    . valACYclovir (VALTREX) 1000 MG tablet Take 500 mg by mouth daily as needed (cold sores).     Current Facility-Administered Medications on File Prior to Visit  Medication Dose Route Frequency Provider Last Rate Last Admin  . sodium chloride flush (NS) 0.9 % injection 10 mL  10 mL Intravenous PRN Eileen Stanford, PA-C   10 mL at 04/04/19 1000    Past Surgical History:  Procedure Laterality Date  . ASD REPAIR  03/31/2018  . ATRIAL SEPTAL DEFECT(ASD) CLOSURE  N/A 03/31/2018   Procedure: ATRIAL SEPTAL DEFECT (ASD) CLOSURE;  Surgeon: Sherren Mocha, MD;  Location: [facility];  Service: Cardiovascular;  Laterality: N/A;  . DOPPLER ECHOCARDIOGRAPHY  01/31/2006  . ELECTROCARDIOGRAM  05/2008  . RIGHT HEART CATH N/A 12/16/2017   Procedure: RIGHT HEART CATH;  Surgeon: Belva Crome, MD;  Location: [facility];  Service: Cardiovascular;  Laterality: N/A;  . TEE WITHOUT CARDIOVERSION N/A 11/28/2013   Procedure: TRANSESOPHAGEAL ECHOCARDIOGRAM (TEE);  Surgeon: Dorothy Spark, MD;  Location: [place] [place];  Service: Cardiovascular;  Laterality: N/A;  . TEE WITHOUT CARDIOVERSION N/A 12/16/2017   Procedure: TRANSESOPHAGEAL ECHOCARDIOGRAM (TEE);  Surgeon: Larey Dresser, MD;  Location: [facility];  Service: Cardiovascular;  Laterality: N/A;    No Known Allergies  Social History   Socioeconomic History  . Marital status: Married    Spouse name: Not on file  . Number of children: 0  . Years of education: Not on file  . Highest education level: Not on file  Occupational History  . Occupation: [place]    Employer: [employer]  Tobacco Use  . Smoking status: Never Smoker  . Smokeless tobacco: Never Used  Vaping Use  . Vaping Use: Never used  Substance and Sexual Activity  . Alcohol use: Yes    Comment: 03/31/2018 "1-2 per month"  . Drug use: Not Currently    Types: Marijuana  Comment: quit 1981 -as a teenager  . Sexual activity: Yes  Other Topics Concern  . Not on file  Social History Narrative   Married and lives at home with her husband   No children   Reasonably active and will walk to work   Social Determinants of Radio broadcast assistant Strain:   . Difficulty of Paying Living Expenses:   Food Insecurity:   . Worried About Charity fundraiser in the Last Year:   . Arboriculturist in the Last Year:   Transportation Needs:   . Film/video editor (Medical):   Marland Kitchen Lack of Transportation  (Non-Medical):   Physical Activity:   . Days of Exercise per Week:   . Minutes of Exercise per Session:   Stress:   . Feeling of Stress :   Social Connections:   . Frequency of Communication with Friends and Family:   . Frequency of Social Gatherings with Friends and Family:   . Attends Religious Services:   . Active Member of Clubs or Organizations:   . Attends Archivist Meetings:   Marland Kitchen Marital Status:   Intimate Partner Violence:   . Fear of Current or Ex-Partner:   . Emotionally Abused:   Marland Kitchen Physically Abused:   . Sexually Abused:     Family History  Problem Relation Age of Onset  . COPD Father   . Mitral valve prolapse Mother        Prothesis    BP 116/78   Ht 5\' 8"  (1.727 m)   Wt 201 lb (91.2 kg)   BMI 30.56 kg/m   Review of Systems: See HPI above.     Objective:  Physical Exam:  Gen: NAD, comfortable in exam room  Left foot/ankle: No gross deformity, swelling, ecchymoses. FROM ankle with 5/5 strength all directions without reproduction of pain. TTP medial calcaneus at plantar fascia insertion.  No tenderness achilles, 5th metatarsal.  No other tenderness. Negative ant drawer and talar tilt.   Negative syndesmotic compression. Negative calcaneal squeeze. Thompsons test negative. NV intact distally.   Assessment & Plan:  1. Left foot pain - primarily due to plantar fasciitis though getting compensatory pain down lateral heel.  Negative calcaneal squeeze; stress fracture unlikely.  Sports insoles with scaphoid pads and heel lifts.  Home exercises and stretches reviewed.  Icing, arch binders.  Tylenol and/or aleve as needed.  Consider injection.  F/u prn if improving.

## 2019-12-31 MED FILL — TACROLIMUS 0.1% OINTMENT: 0.1 | 30 days supply | Qty: 30 | Fill #2

## 2019-12-31 MED FILL — BETAMETHASONE DP AUG 0.05%: 0.05 | 30 days supply | Qty: 45 | Fill #2

## 2020-01-01 MED FILL — FLUARIX QUADRIVALENT 0.5 ML: 0.5 | 1 days supply | Qty: 1 | Fill #0

## 2020-01-29 MED FILL — ATORVASTATIN 20 MG TABLET: 20 | 90 days supply | Qty: 90 | Fill #2

## 2020-01-29 MED FILL — SYNTHROID 112 MCG TABLET: 112 | 90 days supply | Qty: 90 | Fill #2

## 2020-05-02 DIAGNOSIS — Z1231 Encounter for screening mammogram for malignant neoplasm of breast: Secondary | ICD-10-CM | POA: Diagnosis not present

## 2020-05-20 DIAGNOSIS — I429 Cardiomyopathy, unspecified: Secondary | ICD-10-CM | POA: Diagnosis not present

## 2020-05-20 DIAGNOSIS — E039 Hypothyroidism, unspecified: Secondary | ICD-10-CM | POA: Diagnosis not present

## 2020-05-20 DIAGNOSIS — E7800 Pure hypercholesterolemia, unspecified: Secondary | ICD-10-CM | POA: Diagnosis not present

## 2020-05-20 DIAGNOSIS — Z0000 Encounter for general adult medical examination without abnormal findings: Secondary | ICD-10-CM | POA: Diagnosis not present

## 2020-05-30 MED FILL — TACROLIMUS 0.1% OINTMENT: 0.1 | 30 days supply | Qty: 30 | Fill #3

## 2020-05-30 MED FILL — SYNTHROID 112 MCG TABLET: 112 | 90 days supply | Qty: 90 | Fill #3

## 2020-05-30 MED FILL — ATORVASTATIN CALCIUM 20 MG: 20 | 90 days supply | Qty: 90 | Fill #3

## 2020-06-17 DIAGNOSIS — R946 Abnormal results of thyroid function studies: Secondary | ICD-10-CM | POA: Diagnosis not present

## 2020-06-18 ENCOUNTER — Other Ambulatory Visit (HOSPITAL_COMMUNITY): Payer: Self-pay | Admitting: Family Medicine

## 2020-07-16 ENCOUNTER — Other Ambulatory Visit (HOSPITAL_COMMUNITY): Payer: Self-pay

## 2020-07-16 MED FILL — Levothyroxine Sodium Tab 100 MCG: ORAL | 30 days supply | Qty: 30 | Fill #0 | Status: AC

## 2020-07-28 ENCOUNTER — Other Ambulatory Visit (HOSPITAL_COMMUNITY): Payer: Self-pay

## 2020-07-28 MED ORDER — METHYLPREDNISOLONE 4 MG PO TBPK
ORAL_TABLET | ORAL | 0 refills | Status: DC
  Filled 2020-07-28: qty 21, 6d supply, fill #0

## 2020-08-14 ENCOUNTER — Other Ambulatory Visit (HOSPITAL_COMMUNITY): Payer: Self-pay

## 2020-08-14 DIAGNOSIS — L814 Other melanin hyperpigmentation: Secondary | ICD-10-CM | POA: Diagnosis not present

## 2020-08-14 DIAGNOSIS — L821 Other seborrheic keratosis: Secondary | ICD-10-CM | POA: Diagnosis not present

## 2020-08-14 DIAGNOSIS — H52221 Regular astigmatism, right eye: Secondary | ICD-10-CM | POA: Diagnosis not present

## 2020-08-14 DIAGNOSIS — H5203 Hypermetropia, bilateral: Secondary | ICD-10-CM | POA: Diagnosis not present

## 2020-08-14 DIAGNOSIS — D225 Melanocytic nevi of trunk: Secondary | ICD-10-CM | POA: Diagnosis not present

## 2020-08-14 DIAGNOSIS — D2362 Other benign neoplasm of skin of left upper limb, including shoulder: Secondary | ICD-10-CM | POA: Diagnosis not present

## 2020-08-14 DIAGNOSIS — H524 Presbyopia: Secondary | ICD-10-CM | POA: Diagnosis not present

## 2020-08-14 DIAGNOSIS — L900 Lichen sclerosus et atrophicus: Secondary | ICD-10-CM | POA: Diagnosis not present

## 2020-08-14 MED ORDER — TACROLIMUS 0.1 % EX OINT
TOPICAL_OINTMENT | CUTANEOUS | 11 refills | Status: DC
  Filled 2020-08-14: qty 30, 30d supply, fill #0

## 2020-08-15 DIAGNOSIS — E039 Hypothyroidism, unspecified: Secondary | ICD-10-CM | POA: Diagnosis not present

## 2020-08-18 ENCOUNTER — Other Ambulatory Visit (HOSPITAL_COMMUNITY): Payer: Self-pay

## 2020-08-18 MED ORDER — LEVOTHYROXINE SODIUM 88 MCG PO TABS
88.0000 ug | ORAL_TABLET | ORAL | 1 refills | Status: DC
  Filled 2020-08-18: qty 30, 30d supply, fill #0
  Filled 2020-09-15: qty 30, 30d supply, fill #1

## 2020-08-18 MED ORDER — LEVOTHYROXINE SODIUM 112 MCG PO TABS
112.0000 ug | ORAL_TABLET | ORAL | 1 refills | Status: DC
  Filled 2020-08-18: qty 30, 30d supply, fill #0

## 2020-09-09 DIAGNOSIS — Z8774 Personal history of (corrected) congenital malformations of heart and circulatory system: Secondary | ICD-10-CM

## 2020-09-09 DIAGNOSIS — I517 Cardiomegaly: Secondary | ICD-10-CM

## 2020-09-15 ENCOUNTER — Other Ambulatory Visit (HOSPITAL_COMMUNITY): Payer: Self-pay

## 2020-09-15 MED ORDER — ATORVASTATIN CALCIUM 20 MG PO TABS
20.0000 mg | ORAL_TABLET | Freq: Every evening | ORAL | 0 refills | Status: DC
  Filled 2020-09-15: qty 90, 90d supply, fill #0

## 2020-09-16 ENCOUNTER — Other Ambulatory Visit (HOSPITAL_COMMUNITY): Payer: Self-pay

## 2020-09-22 ENCOUNTER — Encounter: Payer: Self-pay | Admitting: Sports Medicine

## 2020-09-22 ENCOUNTER — Ambulatory Visit: Payer: 59 | Admitting: Sports Medicine

## 2020-09-22 VITALS — Ht 68.0 in

## 2020-09-22 DIAGNOSIS — M25512 Pain in left shoulder: Secondary | ICD-10-CM

## 2020-09-22 MED ORDER — METHYLPREDNISOLONE ACETATE 40 MG/ML IJ SUSP
40.0000 mg | Freq: Once | INTRAMUSCULAR | Status: AC
  Administered 2020-09-22: 40 mg via INTRA_ARTICULAR

## 2020-09-22 NOTE — Progress Notes (Signed)
   Subjective:    Patient ID: Erin Rivas, female    DOB: 1960-10-17, 60 y.o.   MRN: [id number]  HPI chief complaint: Left shoulder pain  Erin Rivas comes in today complaining of 3 weeks of worsening anterior and lateral left shoulder pain.  No known trauma.  Pain is noticeable with any sort of movement away from her body or overhead.  Pain does radiate down into her biceps.  She is also getting nighttime pain.  Over-the-counter Aleve does help some.  She has had similar issues in the past which resolved with a subacromial cortisone injection and physical therapy.  An ultrasound evaluation of her left shoulder done by Dr. Barbaraann Barthel in 2020 showed a very small interstitial tear at the insertion of the supraspinatus onto the humeral head along with some subacromial bursitis.    Review of Systems As above    Objective:   Physical Exam  Well-developed, well-nourished.  No acute distress.  Left shoulder: Limited active range of motion due to pain.  Good passive external rotation.  Positive signs of impingement including positive empty can and positive Hawkins.  Rotator cuff strength is 5/5 but does reproduce pain.  Neurovascular intact distally.      Assessment & Plan:   Left shoulder pain likely secondary to rotator cuff impingement/tendinitis  Left subacromial space is injected with cortisone today.  She tolerated this without difficulty.  Once her pain improves we will give her some rotator cuff exercises including Jobes and scapular stabilizers.  If pain persists, consider ultrasound.  Follow-up for ongoing or recalcitrant issues.  Consent obtained and verified. Time-out conducted. Noted no overlying erythema, induration, or other signs of local infection. Skin prepped in a sterile fashion. Topical analgesic spray: Ethyl chloride. Joint: Left shoulder, subacromial Needle: 25g 1.5 inch Completed without difficulty. Meds: 3cc 1% xylocaine, 1cc (40mg ) depomedrol  Advised to call if  fevers/chills, erythema, induration, drainage, or persistent bleeding.

## 2020-10-07 ENCOUNTER — Other Ambulatory Visit (HOSPITAL_COMMUNITY): Payer: Self-pay

## 2020-10-07 ENCOUNTER — Other Ambulatory Visit: Payer: Self-pay | Admitting: *Deleted

## 2020-10-07 MED ORDER — CEPHALEXIN 500 MG PO CAPS
500.0000 mg | ORAL_CAPSULE | Freq: Two times a day (BID) | ORAL | 1 refills | Status: DC
  Filled 2020-10-07: qty 10, 5d supply, fill #0

## 2020-10-21 ENCOUNTER — Other Ambulatory Visit (HOSPITAL_COMMUNITY): Payer: Self-pay

## 2020-10-21 DIAGNOSIS — E039 Hypothyroidism, unspecified: Secondary | ICD-10-CM | POA: Diagnosis not present

## 2020-10-21 MED ORDER — LEVOTHYROXINE SODIUM 88 MCG PO TABS
88.0000 ug | ORAL_TABLET | Freq: Every morning | ORAL | 2 refills | Status: DC
  Filled 2020-10-21: qty 90, 90d supply, fill #0
  Filled 2021-01-19: qty 90, 90d supply, fill #1
  Filled 2021-04-20: qty 90, 90d supply, fill #2

## 2020-10-22 ENCOUNTER — Other Ambulatory Visit (HOSPITAL_COMMUNITY): Payer: Self-pay

## 2021-01-19 ENCOUNTER — Other Ambulatory Visit (HOSPITAL_COMMUNITY): Payer: Self-pay

## 2021-01-19 MED ORDER — ATORVASTATIN CALCIUM 20 MG PO TABS
20.0000 mg | ORAL_TABLET | Freq: Every evening | ORAL | 1 refills | Status: DC
  Filled 2021-01-19: qty 90, 90d supply, fill #0
  Filled 2021-04-20: qty 90, 90d supply, fill #1

## 2021-01-21 ENCOUNTER — Other Ambulatory Visit (HOSPITAL_COMMUNITY): Payer: Self-pay

## 2021-01-21 MED ORDER — INFLUENZA VAC SPLIT QUAD 0.5 ML IM SUSY
0.5000 mL | PREFILLED_SYRINGE | INTRAMUSCULAR | 0 refills | Status: DC
  Filled 2021-01-21: qty 0.5, 1d supply, fill #0

## 2021-03-16 ENCOUNTER — Other Ambulatory Visit: Payer: Self-pay

## 2021-03-16 ENCOUNTER — Ambulatory Visit (HOSPITAL_COMMUNITY): Payer: 59 | Attending: Cardiovascular Disease

## 2021-03-16 ENCOUNTER — Ambulatory Visit: Payer: 59 | Admitting: Cardiovascular Disease

## 2021-03-16 ENCOUNTER — Encounter: Payer: Self-pay | Admitting: Cardiovascular Disease

## 2021-03-16 VITALS — BP 114/82 | HR 65 | Ht 68.0 in | Wt 204.0 lb

## 2021-03-16 DIAGNOSIS — I517 Cardiomegaly: Secondary | ICD-10-CM

## 2021-03-16 DIAGNOSIS — Z8774 Personal history of (corrected) congenital malformations of heart and circulatory system: Secondary | ICD-10-CM

## 2021-03-16 LAB — ECHOCARDIOGRAM COMPLETE
Area-P 1/2: 4.15 cm2
S' Lateral: 3.3 cm

## 2021-03-16 NOTE — Progress Notes (Signed)
Cardiology Office Note:    Date:  03/17/2021   ID:  [name], DOB July 18, 1960, MRN [id number]  PCP:  Donald Prose, MD   [facility] Providers Cardiologist:  Sherren Mocha, MD     Referring MD: Donald Prose, MD   Chief Complaint  Patient presents with   Atrial Septal Defect     History of Present Illness:    Erin Rivas is a 60 y.o. female with a hx of multi fenestrated atrial septal defect who underwent transcatheter closure in 2019 with 2 atrial septal occluder devices.  She presents today for follow-up evaluation.  Her postprocedural imaging demonstrated complete closure of her atrial septum with no residual shunting by color flow or bubble study.  The patient is doing well today.  She has lost about 20 pounds through diet and exercise.  She denies chest pain, chest pressure, or shortness of breath.  No edema, orthopnea, or PND.  Past Medical History:  Diagnosis Date   ASD (atrial septal defect)    a. 03/2018 s/p closure w/ 38mm Amplatzer cribriform device and 73mm Amplatzer PFO occluder.   Heart murmur    Hyperlipidemia    Hypothyroidism    Migraine    hx; would not occur without exertion; "none in years" (03/31/2018)   Obesity    Sinus bradycardia    Trace mitral regurgitation by prior echocardiogram 01/31/2006   Trace tricuspid regurgitation by prior echocardiogram 01/31/2006    Past Surgical History:  Procedure Laterality Date   ASD REPAIR  03/31/2018   ATRIAL SEPTAL DEFECT(ASD) CLOSURE N/A 03/31/2018   Procedure: ATRIAL SEPTAL DEFECT (ASD) CLOSURE;  Surgeon: Sherren Mocha, MD;  Location: [facility];  Service: Cardiovascular;  Laterality: N/A;   DOPPLER ECHOCARDIOGRAPHY  01/31/2006   ELECTROCARDIOGRAM  05/2008   RIGHT HEART CATH N/A 12/16/2017   Procedure: RIGHT HEART CATH;  Surgeon: Belva Crome, MD;  Location: [facility];  Service: Cardiovascular;  Laterality: N/A;   TEE WITHOUT CARDIOVERSION N/A 11/28/2013   Procedure:  TRANSESOPHAGEAL ECHOCARDIOGRAM (TEE);  Surgeon: Dorothy Spark, MD;  Location: [facility];  Service: Cardiovascular;  Laterality: N/A;   TEE WITHOUT CARDIOVERSION N/A 12/16/2017   Procedure: TRANSESOPHAGEAL ECHOCARDIOGRAM (TEE);  Surgeon: Larey Dresser, MD;  Location: [facility];  Service: Cardiovascular;  Laterality: N/A;    Current Medications: Current Meds  Medication Sig   aspirin 81 MG tablet Take 81 mg by mouth daily at 12 noon.   atorvastatin (LIPITOR) 20 MG tablet Take 1 tablet (20 mg total) by mouth every evening.   augmented betamethasone dipropionate (DIPROLENE-AF) 0.05 % ointment SMARTSIG:Sparingly Topical Twice Daily   estradiol (ESTRACE) 0.1 MG/GM vaginal cream    influenza vac split quadrivalent PF (FLUARIX) 0.5 ML injection Inject 0.5 mLs into the muscle.   levothyroxine (SYNTHROID) 88 MCG tablet Take 1 tablet (88 mcg total) by mouth in the morning on an empty stomach.   tacrolimus (PROTOPIC) 0.1 % ointment SMARTSIG:Sparingly Topical Twice Daily   tacrolimus (PROTOPIC) 0.1 % ointment Apply a thin layer to the affected area twice daily for 5 days per week.   valACYclovir (VALTREX) 1000 MG tablet Take 500 mg by mouth daily as needed (cold sores).   valACYclovir (VALTREX) 1000 MG tablet TAKE 2 TABLETS BY MOUTH EVERY 12 HOURS AS NEEDED FOR OUTBREAKS     Allergies:   Patient has no known allergies.   Social History   Socioeconomic History   Marital status: Married    Spouse name: Not  on file   Number of children: 0   Years of education: Not on file   Highest education level: Not on file  Occupational History   Occupation: [employer] [place]    Employer: [employer]  Tobacco Use   Smoking status: Never   Smokeless tobacco: Never  Vaping Use   Vaping Use: Never used  Substance and Sexual Activity   Alcohol use: Yes    Comment: 03/31/2018 "1-2 per month"   Drug use: Not Currently    Types: Marijuana    Comment: quit 1981 -as a teenager   Sexual  activity: Yes  Other Topics Concern   Not on file  Social History Narrative   Married and lives at home with her husband   No children   Reasonably active and will walk to work   Social Determinants of Radio broadcast assistant Strain: Not on file  Food Insecurity: Not on file  Transportation Needs: Not on file  Physical Activity: Not on file  Stress: Not on file  Social Connections: Not on file     Family History: The patient's family history includes COPD in her father; Mitral valve prolapse in her mother.  ROS:   Please see the history of present illness.    All other systems reviewed and are negative.  EKGs/Labs/Other Studies Reviewed:    The following studies were reviewed today: Echo images personally reviewed.  LV and RV function appear normal.  Interatrial septal occluder devices appear fully intact with no residual shunt by color-flow Doppler.  EKG:  EKG is ordered today.  The ekg ordered today demonstrates normal sinus rhythm 65 bpm, within normal limits.  Recent Labs: No results found for requested labs within last 8760 hours.  Recent Lipid Panel No results found for: CHOL, TRIG, HDL, CHOLHDL, VLDL, LDLCALC, LDLDIRECT   Risk Assessment/Calculations:           Physical Exam:    VS:  BP 114/82   Pulse 65   Ht 5\' 8"  (1.727 m)   Wt 204 lb (92.5 kg)   SpO2 97%   BMI 31.02 kg/m     Wt Readings from Last 3 Encounters:  03/16/21 204 lb (92.5 kg)  10/22/19 201 lb (91.2 kg)  04/05/19 223 lb 6.4 oz (101.3 kg)     GEN:  Well nourished, well developed in no acute distress HEENT: Normal NECK: No JVD; No carotid bruits LYMPHATICS: No lymphadenopathy CARDIAC: RRR, no murmurs, rubs, gallops RESPIRATORY:  Clear to auscultation without rales, wheezing or rhonchi  ABDOMEN: Soft, non-tender, non-distended MUSCULOSKELETAL:  No edema; No deformity  SKIN: Warm and dry NEUROLOGIC:  Alert and oriented x 3 PSYCHIATRIC:  Normal affect   ASSESSMENT:    1. S/P  atrial septal defect closure    PLAN:    In order of problems listed above:  The patient is doing well with respect to her complex atrial septal defect now 3 years status post transcatheter closure.  Her echo images are personally reviewed and show normal LV and RV function with intact atrial septal closure and no shunting detected by color-flow Doppler.  She takes aspirin 81 mg and atorvastatin for lipid-lowering, no other cardiac medications.  She no longer requires SBE prophylaxis.  I will see her back for clinical follow-up in 2 years.  I do not think she needs further imaging surveillance unless she develops new symptoms or change in clinical status..    Medication Adjustments/Labs and Tests Ordered: Current medicines are reviewed  at length with the patient today.  Concerns regarding medicines are outlined above.  Orders Placed This Encounter  Procedures   EKG 12-Lead   No orders of the defined types were placed in this encounter.   Patient Instructions  Medication Instructions:  Your physician recommends that you continue on your current medications as directed. Please refer to the Current Medication list given to you today.  *If you need a refill on your cardiac medications before your next appointment, please call your pharmacy*   Lab Work: None If you have labs (blood work) drawn today and your tests are completely normal, you will receive your results only by: [place] (if you have MyChart) OR A paper copy in the mail If you have any lab test that is abnormal or we need to change your treatment, we will call you to review the results.   Testing/Procedures: None   Follow-Up: At [facility], you and your health needs are our priority.  As part of our continuing mission to provide you with exceptional heart care, we have created designated Provider Care Teams.  These Care Teams include your primary Cardiologist (physician) and Advanced Practice Providers (APPs  -  Physician Assistants and Nurse Practitioners) who all work together to provide you with the care you need, when you need it.  We recommend signing up for the patient portal called "MyChart".  Sign up information is provided on this After Visit Summary.  MyChart is used to connect with patients for Virtual Visits (Telemedicine).  Patients are able to view lab/test results, encounter notes, upcoming appointments, etc.  Non-urgent messages can be sent to your provider as well.   To learn more about what you can do with MyChart, go to [email or web address].    Your next appointment:   2 year(s)  The format for your next appointment:   In Person  Provider:   Sherren Mocha, MD     Other Instructions     Signed, Sherren Mocha, MD  03/17/2021 11:34 AM    [place]

## 2021-03-16 NOTE — Patient Instructions (Signed)
Medication Instructions:  Your physician recommends that you continue on your current medications as directed. Please refer to the Current Medication list given to you today.  *If you need a refill on your cardiac medications before your next appointment, please call your pharmacy*   Lab Work: None If you have labs (blood work) drawn today and your tests are completely normal, you will receive your results only by: [place] (if you have MyChart) OR A paper copy in the mail If you have any lab test that is abnormal or we need to change your treatment, we will call you to review the results.   Testing/Procedures: None   Follow-Up: At [facility], you and your health needs are our priority.  As part of our continuing mission to provide you with exceptional heart care, we have created designated Provider Care Teams.  These Care Teams include your primary Cardiologist (physician) and Advanced Practice Providers (APPs -  Physician Assistants and Nurse Practitioners) who all work together to provide you with the care you need, when you need it.  We recommend signing up for the patient portal called "MyChart".  Sign up information is provided on this After Visit Summary.  MyChart is used to connect with patients for Virtual Visits (Telemedicine).  Patients are able to view lab/test results, encounter notes, upcoming appointments, etc.  Non-urgent messages can be sent to your provider as well.   To learn more about what you can do with MyChart, go to [email or web address].    Your next appointment:   2 year(s)  The format for your next appointment:   In Person  Provider:   Sherren Mocha, MD     Other Instructions

## 2021-03-17 ENCOUNTER — Ambulatory Visit: Payer: 59 | Attending: Internal Medicine

## 2021-03-17 ENCOUNTER — Other Ambulatory Visit (HOSPITAL_BASED_OUTPATIENT_CLINIC_OR_DEPARTMENT_OTHER): Payer: Self-pay

## 2021-03-17 ENCOUNTER — Encounter: Payer: Self-pay | Admitting: Cardiovascular Disease

## 2021-03-17 DIAGNOSIS — Z23 Encounter for immunization: Secondary | ICD-10-CM

## 2021-03-17 MED ORDER — PFIZER COVID-19 VAC BIVALENT 30 MCG/0.3ML IM SUSP
INTRAMUSCULAR | 0 refills | Status: DC
  Filled 2021-03-17: qty 0.3, 1d supply, fill #0

## 2021-03-17 NOTE — Progress Notes (Signed)
° °  Covid-19 Vaccination Clinic  Name:  ADELENA DESANTIAGO    MRN: [id number] DOB: [birth year]  03/17/2021  Ms. Byrd was observed post Covid-19 immunization for 15 minutes without incident. She was provided with Vaccine Information Sheet and instruction to access the V-Safe system.   Ms. Penny was instructed to call 911 with any severe reactions post vaccine: Difficulty breathing  Swelling of face and throat  A fast heartbeat  A bad rash all over body  Dizziness and weakness   Immunizations Administered     Name Date Dose VIS Date Route   Pfizer Covid-19 Vaccine Bivalent Booster 03/17/2021  1:05 PM 0.3 mL 12/03/2020 Intramuscular   Manufacturer: [place]   Lot: [id number]   [place]: [phone]

## 2021-04-03 ENCOUNTER — Other Ambulatory Visit (HOSPITAL_COMMUNITY): Payer: 59

## 2021-04-03 ENCOUNTER — Ambulatory Visit: Payer: 59 | Admitting: Cardiovascular Disease

## 2021-04-20 ENCOUNTER — Other Ambulatory Visit (HOSPITAL_COMMUNITY): Payer: Self-pay

## 2021-05-08 DIAGNOSIS — Z1231 Encounter for screening mammogram for malignant neoplasm of breast: Secondary | ICD-10-CM | POA: Diagnosis not present

## 2021-06-09 ENCOUNTER — Other Ambulatory Visit (HOSPITAL_COMMUNITY): Payer: Self-pay

## 2021-06-09 DIAGNOSIS — Z8774 Personal history of (corrected) congenital malformations of heart and circulatory system: Secondary | ICD-10-CM | POA: Diagnosis not present

## 2021-06-09 DIAGNOSIS — B001 Herpesviral vesicular dermatitis: Secondary | ICD-10-CM | POA: Diagnosis not present

## 2021-06-09 DIAGNOSIS — Z0000 Encounter for general adult medical examination without abnormal findings: Secondary | ICD-10-CM | POA: Diagnosis not present

## 2021-06-09 DIAGNOSIS — Z23 Encounter for immunization: Secondary | ICD-10-CM | POA: Diagnosis not present

## 2021-06-09 DIAGNOSIS — E039 Hypothyroidism, unspecified: Secondary | ICD-10-CM | POA: Diagnosis not present

## 2021-06-09 DIAGNOSIS — E7800 Pure hypercholesterolemia, unspecified: Secondary | ICD-10-CM | POA: Diagnosis not present

## 2021-06-09 MED ORDER — ATORVASTATIN CALCIUM 20 MG PO TABS
20.0000 mg | ORAL_TABLET | Freq: Every evening | ORAL | 3 refills | Status: DC
  Filled 2021-06-09 – 2021-08-21 (×2): qty 90, 90d supply, fill #0
  Filled 2021-11-24: qty 90, 90d supply, fill #1
  Filled 2022-03-08: qty 90, 90d supply, fill #2
  Filled 2022-06-09: qty 90, 90d supply, fill #3

## 2021-06-09 MED ORDER — LEVOTHYROXINE SODIUM 88 MCG PO TABS
88.0000 ug | ORAL_TABLET | Freq: Every morning | ORAL | 3 refills | Status: DC
  Filled 2021-06-09: qty 90, 90d supply, fill #0
  Filled 2021-09-30: qty 90, 90d supply, fill #1

## 2021-08-20 ENCOUNTER — Other Ambulatory Visit (HOSPITAL_COMMUNITY): Payer: Self-pay

## 2021-08-20 DIAGNOSIS — L821 Other seborrheic keratosis: Secondary | ICD-10-CM | POA: Diagnosis not present

## 2021-08-20 DIAGNOSIS — D225 Melanocytic nevi of trunk: Secondary | ICD-10-CM | POA: Diagnosis not present

## 2021-08-20 DIAGNOSIS — L900 Lichen sclerosus et atrophicus: Secondary | ICD-10-CM | POA: Diagnosis not present

## 2021-08-20 DIAGNOSIS — L814 Other melanin hyperpigmentation: Secondary | ICD-10-CM | POA: Diagnosis not present

## 2021-08-20 DIAGNOSIS — D2272 Melanocytic nevi of left lower limb, including hip: Secondary | ICD-10-CM | POA: Diagnosis not present

## 2021-08-20 MED ORDER — TACROLIMUS 0.1 % EX OINT
1.0000 "application " | TOPICAL_OINTMENT | Freq: Every day | CUTANEOUS | 11 refills | Status: DC
  Filled 2021-08-20: qty 30, 30d supply, fill #0
  Filled 2022-03-25: qty 30, 30d supply, fill #1

## 2021-08-20 MED ORDER — BETAMETHASONE DIPROPIONATE AUG 0.05 % EX OINT
1.0000 "application " | TOPICAL_OINTMENT | Freq: Two times a day (BID) | CUTANEOUS | 3 refills | Status: DC
  Filled 2021-08-20: qty 50, 25d supply, fill #0

## 2021-08-21 ENCOUNTER — Other Ambulatory Visit (HOSPITAL_COMMUNITY): Payer: Self-pay

## 2021-08-24 ENCOUNTER — Other Ambulatory Visit (HOSPITAL_COMMUNITY): Payer: Self-pay

## 2021-08-24 MED ORDER — ESTRADIOL 0.1 MG/GM VA CREA
2.0000 g | TOPICAL_CREAM | VAGINAL | 0 refills | Status: DC
  Filled 2021-08-24: qty 42.5, 60d supply, fill #0

## 2021-09-01 DIAGNOSIS — H524 Presbyopia: Secondary | ICD-10-CM | POA: Diagnosis not present

## 2021-09-01 DIAGNOSIS — H52221 Regular astigmatism, right eye: Secondary | ICD-10-CM | POA: Diagnosis not present

## 2021-09-01 DIAGNOSIS — H5203 Hypermetropia, bilateral: Secondary | ICD-10-CM | POA: Diagnosis not present

## 2021-09-01 DIAGNOSIS — H2513 Age-related nuclear cataract, bilateral: Secondary | ICD-10-CM | POA: Diagnosis not present

## 2021-09-30 ENCOUNTER — Other Ambulatory Visit (HOSPITAL_COMMUNITY): Payer: Self-pay

## 2021-11-25 ENCOUNTER — Other Ambulatory Visit (HOSPITAL_COMMUNITY): Payer: Self-pay

## 2021-12-10 DIAGNOSIS — E039 Hypothyroidism, unspecified: Secondary | ICD-10-CM | POA: Diagnosis not present

## 2021-12-11 ENCOUNTER — Other Ambulatory Visit (HOSPITAL_COMMUNITY): Payer: Self-pay

## 2021-12-11 MED ORDER — LEVOTHYROXINE SODIUM 100 MCG PO TABS
100.0000 ug | ORAL_TABLET | ORAL | 1 refills | Status: AC
Start: 2021-12-11 — End: ?
  Filled 2021-12-11 – 2021-12-14 (×2): qty 45, 90d supply, fill #0
  Filled 2022-03-08: qty 45, 90d supply, fill #1
  Filled 2022-06-09: qty 45, 90d supply, fill #2
  Filled 2022-09-09: qty 45, 90d supply, fill #3

## 2021-12-11 MED ORDER — LEVOTHYROXINE SODIUM 88 MCG PO TABS
88.0000 ug | ORAL_TABLET | Freq: Every morning | ORAL | 1 refills | Status: DC
  Filled 2021-12-11: qty 45, 90d supply, fill #0
  Filled 2022-03-08: qty 90, 90d supply, fill #0

## 2021-12-14 ENCOUNTER — Other Ambulatory Visit (HOSPITAL_COMMUNITY): Payer: Self-pay

## 2022-02-03 ENCOUNTER — Telehealth: Payer: Self-pay | Admitting: Family Medicine

## 2022-02-03 ENCOUNTER — Ambulatory Visit
Admission: RE | Admit: 2022-02-03 | Discharge: 2022-02-03 | Disposition: A | Discharge status: Home / Self Care | Payer: 59 | Source: Ambulatory Visit | Attending: Family Medicine | Admitting: Family Medicine

## 2022-02-03 DIAGNOSIS — S299XXA Unspecified injury of thorax, initial encounter: Secondary | ICD-10-CM

## 2022-02-03 DIAGNOSIS — R0789 Other chest pain: Secondary | ICD-10-CM | POA: Diagnosis not present

## 2022-02-03 NOTE — Telephone Encounter (Signed)
Received phone call this morning from [place]. She fell yesterday evening while at[place]cery store. Tripped over "my own feet" and fell forward into end of aisle display, landing mostly on upper chest. No LOC. No dizziness or other preceeding symptoms. Did OK overnight but this Am she is having a lot of anterior upper chest discomfort. Hurst if she takes a deep breath or moves in rotational fashion with her trunk.  Also notes herpulse is about 10 beats higher than usual. She is mopstly concerned that she may have "punctured a lung" with a broklen rib. No overt SOB.  We discussed. She is on her way to work as we spoke. Will put in for xrays of chest/ribs and then I can see her either at [facility] or [facility] if needed. She mostly wants reassurance she has not poked a broken rib into her lung. Pain is not bad but worrisome.  I will put in for xrays and then we wil touch base later today.

## 2022-02-05 ENCOUNTER — Other Ambulatory Visit: Payer: Self-pay | Admitting: Family Medicine

## 2022-02-05 ENCOUNTER — Other Ambulatory Visit (HOSPITAL_COMMUNITY): Payer: Self-pay

## 2022-02-05 MED ORDER — CYCLOBENZAPRINE HCL 10 MG PO TABS
10.0000 mg | ORAL_TABLET | Freq: Two times a day (BID) | ORAL | 0 refills | Status: AC | PRN
  Filled 2022-02-05: qty 30, 10d supply, fill #0

## 2022-02-05 NOTE — Progress Notes (Signed)
F/u fall with chest wall contusions. Yesterday pain was moderate but worse today. Bruising on left chest. No pain or ttp clavicle. Can take full breath. CXR negatiove. Aleve OK. Flexeril PRN sent in. Callif  new or worse sx, especially any SOB.

## 2022-03-08 ENCOUNTER — Other Ambulatory Visit (HOSPITAL_COMMUNITY): Payer: Self-pay

## 2022-03-09 ENCOUNTER — Other Ambulatory Visit (HOSPITAL_COMMUNITY): Payer: Self-pay

## 2022-03-25 ENCOUNTER — Other Ambulatory Visit (HOSPITAL_COMMUNITY): Payer: Self-pay

## 2022-03-25 ENCOUNTER — Other Ambulatory Visit: Payer: Self-pay

## 2022-05-13 DIAGNOSIS — Z1231 Encounter for screening mammogram for malignant neoplasm of breast: Secondary | ICD-10-CM | POA: Diagnosis not present

## 2022-06-10 ENCOUNTER — Other Ambulatory Visit: Payer: Self-pay

## 2022-06-11 ENCOUNTER — Other Ambulatory Visit (HOSPITAL_COMMUNITY): Payer: Self-pay

## 2022-06-11 MED ORDER — ESTRADIOL 0.1 MG/GM VA CREA
2.0000 g | TOPICAL_CREAM | VAGINAL | 0 refills | Status: AC
  Filled 2022-06-11: qty 42.5, 90d supply, fill #0

## 2022-06-14 ENCOUNTER — Other Ambulatory Visit (HOSPITAL_COMMUNITY): Payer: Self-pay

## 2022-06-14 MED ORDER — ATORVASTATIN CALCIUM 20 MG PO TABS
20.0000 mg | ORAL_TABLET | Freq: Every evening | ORAL | 0 refills | Status: DC
  Filled 2022-06-14: qty 90, 90d supply, fill #0

## 2022-07-13 ENCOUNTER — Other Ambulatory Visit (HOSPITAL_COMMUNITY): Payer: Self-pay

## 2022-07-13 DIAGNOSIS — I429 Cardiomyopathy, unspecified: Secondary | ICD-10-CM | POA: Diagnosis not present

## 2022-07-13 DIAGNOSIS — E039 Hypothyroidism, unspecified: Secondary | ICD-10-CM | POA: Diagnosis not present

## 2022-07-13 DIAGNOSIS — L900 Lichen sclerosus et atrophicus: Secondary | ICD-10-CM | POA: Diagnosis not present

## 2022-07-13 DIAGNOSIS — Z8774 Personal history of (corrected) congenital malformations of heart and circulatory system: Secondary | ICD-10-CM | POA: Diagnosis not present

## 2022-07-13 DIAGNOSIS — N951 Menopausal and female climacteric states: Secondary | ICD-10-CM | POA: Diagnosis not present

## 2022-07-13 DIAGNOSIS — E7800 Pure hypercholesterolemia, unspecified: Secondary | ICD-10-CM | POA: Diagnosis not present

## 2022-07-13 DIAGNOSIS — B001 Herpesviral vesicular dermatitis: Secondary | ICD-10-CM | POA: Diagnosis not present

## 2022-07-13 DIAGNOSIS — Z0000 Encounter for general adult medical examination without abnormal findings: Secondary | ICD-10-CM | POA: Diagnosis not present

## 2022-07-13 MED ORDER — LEVOTHYROXINE SODIUM 88 MCG PO TABS
88.0000 ug | ORAL_TABLET | ORAL | 1 refills | Status: DC
  Filled 2022-07-13: qty 45, 90d supply, fill #0
  Filled 2022-07-13: qty 30, 60d supply, fill #0
  Filled 2022-12-06: qty 45, 90d supply, fill #1
  Filled 2023-03-08: qty 45, 90d supply, fill #2
  Filled 2023-06-06: qty 45, 90d supply, fill #3

## 2022-07-13 MED ORDER — LEVOTHYROXINE SODIUM 100 MCG PO TABS
ORAL_TABLET | ORAL | 1 refills | Status: DC
  Filled 2022-07-13: qty 60, 30d supply, fill #0
  Filled 2022-12-10: qty 45, 90d supply, fill #0
  Filled 2023-03-08: qty 45, 90d supply, fill #1
  Filled 2023-06-06: qty 45, 90d supply, fill #2

## 2022-07-13 MED ORDER — ATORVASTATIN CALCIUM 40 MG PO TABS
40.0000 mg | ORAL_TABLET | Freq: Every day | ORAL | 0 refills | Status: DC
  Filled 2022-07-13: qty 90, 90d supply, fill #0

## 2022-07-14 ENCOUNTER — Other Ambulatory Visit: Payer: Self-pay

## 2022-07-21 ENCOUNTER — Other Ambulatory Visit (HOSPITAL_COMMUNITY): Payer: Self-pay

## 2022-07-21 DIAGNOSIS — L900 Lichen sclerosus et atrophicus: Secondary | ICD-10-CM | POA: Diagnosis not present

## 2022-07-21 DIAGNOSIS — N951 Menopausal and female climacteric states: Secondary | ICD-10-CM | POA: Diagnosis not present

## 2022-07-21 MED ORDER — ESTRADIOL 0.1 MG/GM VA CREA
1.0000 g | TOPICAL_CREAM | VAGINAL | 3 refills | Status: DC
  Filled 2022-07-21: qty 42.5, 90d supply, fill #0

## 2022-07-21 MED ORDER — CLOBETASOL PROPIONATE 0.05 % EX OINT
1.0000 | TOPICAL_OINTMENT | CUTANEOUS | 3 refills | Status: AC
  Filled 2022-07-21: qty 30, 20d supply, fill #0
  Filled 2022-12-06: qty 30, 20d supply, fill #1

## 2022-09-10 ENCOUNTER — Other Ambulatory Visit: Payer: Self-pay

## 2022-09-13 ENCOUNTER — Other Ambulatory Visit (HOSPITAL_COMMUNITY): Payer: Self-pay

## 2022-10-18 ENCOUNTER — Other Ambulatory Visit (HOSPITAL_COMMUNITY): Payer: Self-pay

## 2022-10-19 ENCOUNTER — Other Ambulatory Visit (HOSPITAL_COMMUNITY): Payer: Self-pay

## 2022-10-19 MED ORDER — ATORVASTATIN CALCIUM 40 MG PO TABS
40.0000 mg | ORAL_TABLET | Freq: Every day | ORAL | 2 refills | Status: DC
  Filled 2022-10-19: qty 90, 90d supply, fill #0
  Filled 2023-01-18: qty 90, 90d supply, fill #1
  Filled 2023-04-20: qty 90, 90d supply, fill #2

## 2022-12-06 ENCOUNTER — Other Ambulatory Visit (HOSPITAL_COMMUNITY): Payer: Self-pay

## 2022-12-08 ENCOUNTER — Other Ambulatory Visit (HOSPITAL_COMMUNITY): Payer: Self-pay

## 2022-12-10 ENCOUNTER — Other Ambulatory Visit (HOSPITAL_COMMUNITY): Payer: Self-pay

## 2022-12-30 DIAGNOSIS — H5203 Hypermetropia, bilateral: Secondary | ICD-10-CM | POA: Diagnosis not present

## 2023-03-22 ENCOUNTER — Other Ambulatory Visit (HOSPITAL_COMMUNITY): Payer: Self-pay

## 2023-03-22 MED ORDER — VALACYCLOVIR HCL 1 G PO TABS
2000.0000 mg | ORAL_TABLET | Freq: Two times a day (BID) | ORAL | 0 refills | Status: AC | PRN
  Filled 2023-03-22: qty 20, 5d supply, fill #0

## 2023-06-08 ENCOUNTER — Other Ambulatory Visit (HOSPITAL_COMMUNITY): Payer: Self-pay

## 2023-07-14 ENCOUNTER — Ambulatory Visit: Payer: Commercial Managed Care - PPO | Admitting: Cardiovascular Disease

## 2023-07-21 ENCOUNTER — Other Ambulatory Visit (HOSPITAL_COMMUNITY): Payer: Self-pay

## 2023-07-23 ENCOUNTER — Other Ambulatory Visit (HOSPITAL_BASED_OUTPATIENT_CLINIC_OR_DEPARTMENT_OTHER): Payer: Self-pay

## 2023-07-23 ENCOUNTER — Other Ambulatory Visit (HOSPITAL_COMMUNITY): Payer: Self-pay

## 2023-07-23 MED ORDER — ATORVASTATIN CALCIUM 40 MG PO TABS
40.0000 mg | ORAL_TABLET | Freq: Every day | ORAL | 0 refills | Status: DC
  Filled 2023-07-23: qty 90, 90d supply, fill #0

## 2023-07-27 ENCOUNTER — Other Ambulatory Visit (HOSPITAL_COMMUNITY): Payer: Self-pay

## 2023-08-10 DIAGNOSIS — Z8774 Personal history of (corrected) congenital malformations of heart and circulatory system: Secondary | ICD-10-CM | POA: Diagnosis not present

## 2023-08-10 DIAGNOSIS — I429 Cardiomyopathy, unspecified: Secondary | ICD-10-CM | POA: Diagnosis not present

## 2023-08-10 DIAGNOSIS — N951 Menopausal and female climacteric states: Secondary | ICD-10-CM | POA: Diagnosis not present

## 2023-08-10 DIAGNOSIS — R0683 Snoring: Secondary | ICD-10-CM | POA: Diagnosis not present

## 2023-08-10 DIAGNOSIS — E039 Hypothyroidism, unspecified: Secondary | ICD-10-CM | POA: Diagnosis not present

## 2023-08-10 DIAGNOSIS — B001 Herpesviral vesicular dermatitis: Secondary | ICD-10-CM | POA: Diagnosis not present

## 2023-08-10 DIAGNOSIS — L900 Lichen sclerosus et atrophicus: Secondary | ICD-10-CM | POA: Diagnosis not present

## 2023-08-10 DIAGNOSIS — Z0000 Encounter for general adult medical examination without abnormal findings: Secondary | ICD-10-CM | POA: Diagnosis not present

## 2023-08-10 DIAGNOSIS — E7800 Pure hypercholesterolemia, unspecified: Secondary | ICD-10-CM | POA: Diagnosis not present

## 2023-08-18 NOTE — Progress Notes (Signed)
 Cardiology Office Note:    Date:  08/19/2023  ID:  Geroge Rivas, DOB [birth year], MRN [id number] PCP: Rivas, Vyvyan, MD  [facility] Providers Cardiologist:  Arnoldo Lapping, MD       Patient Profile:      Hx of multi fenestrated atrial septal defect s/p transcatheter closure in 2019 with 2 atrial septal occluder devices  TTE 03/16/2021: EF 60-65, no RWMA, mild asymmetric LVH, GR 1 DD, normal RVSF, mild LAE, trivial MR, AV sclerosis, s/p ASD closure with no residual shunting CCTA 11/12/2013: CAC score 0, no CAD Hyperlipidemia Hypothyroidism        Discussed the use of AI scribe software for clinical note transcription with the patient, who gave verbal consent to proceed.  History of Present Illness Erin Rivas is a 63 y.o. female who returns for follow-up of atrial septal defect.  She underwent transcatheter closure in 2019 with 2 atrial septal occluder devices by Dr. Arlester Ladd.  She last saw Dr. Arlester Ladd December 2022.  Echocardiogram at that time demonstrated intact atrial septal closure and no shunting.  She is here alone. She experiences shortness of breath primarily during activities such as climbing stairs or taking long walks, which she attributes to a recent weight gain of about 20 pounds. No orthopnea, peripheral edema, syncope, or pleuritic chest pain. 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 She has not been engaging in regular exercise recently. Since the ASD closure, she no longer experiences flushing during long walks, which she used to experience prior to the procedure.     ROS-See HPI        Studies Reviewed:   EKG Interpretation Date/Time:  Friday Aug 19 2023 09:07:26 EDT Ventricular Rate:  61 PR Interval:  178 QRS Duration:  92 QT Interval:  466 QTC Calculation: 469 R Axis:   -3  Text Interpretation: Normal sinus rhythm RSR' or QR pattern in V1 suggests right ventricular  conduction delay Nonspecific T wave abnormality No significant change since last tracing Confirmed by Marlyse Single [phone]) on 08/19/2023 9:11:49 AM     Results     Risk Assessment/Calculations:             Physical Exam:   VS:  BP 123/72   Pulse 61   Ht 5\' 7"  (1.702 m)   Wt 224 lb 12.8 oz (102 kg)   SpO2 95%   BMI 35.21 kg/m    Wt Readings from Last 3 Encounters:  08/19/23 224 lb 12.8 oz (102 kg)  03/16/21 204 lb (92.5 kg)  10/22/19 201 lb (91.2 kg)    Constitutional:      Appearance: Healthy appearance. Not in distress.  Neck:     Vascular: JVD normal.  Pulmonary:     Breath sounds: Normal breath sounds. No wheezing. No rales.  Cardiovascular:     Normal rate. Regular rhythm.     Murmurs: There is no murmur.  Edema:    Peripheral edema absent.  Abdominal:     Palpations: Abdomen is soft.        Assessment and Plan:  Assessment and Plan Assessment & Plan Atrial septal defect  S/p closure in 2019.  Echocardiogram in December 2022 demonstrated stable closure with no residual shunting.  Current EKG shows normal rhythm with no changes.  She is not having any symptoms such as chest pain, palpitations, or syncope. Mild dyspnea likely related to recent weight  gain. - Follow-up 2 years.         Dispo:  Return in about 2 years (around 08/18/2025) for Routine Follow Up, w/ Dr. Arlester Ladd.  Signed, Marlyse Single, PA-C

## 2023-08-19 ENCOUNTER — Ambulatory Visit: Attending: Physician Assistant | Admitting: Physician Assistant

## 2023-08-19 ENCOUNTER — Encounter: Payer: Self-pay | Admitting: Physician Assistant

## 2023-08-19 VITALS — BP 123/72 | HR 61 | Ht 67.0 in | Wt 224.8 lb

## 2023-08-19 DIAGNOSIS — Q211 Atrial septal defect, unspecified: Secondary | ICD-10-CM

## 2023-08-19 NOTE — Patient Instructions (Signed)
 Medication Instructions:  Your physician recommends that you continue on your current medications as directed. Please refer to the Current Medication list given to you today.  *If you need a refill on your cardiac medications before your next appointment, please call your pharmacy*  Lab Work: None ordered  If you have labs (blood work) drawn today and your tests are completely normal, you will receive your results only by: MyChart [place] (if you have MyChart) OR A paper copy in the mail If you have any lab test that is abnormal or we need to change your treatment, we will call you to review the results.  Testing/Procedures: None ordere  Follow-Up: At [facility], you and your health needs are our priority.  As part of our continuing mission to provide you with exceptional heart care, our providers are all part of one team.  This team includes your primary Cardiologist (physician) and Advanced Practice Providers or APPs (Physician Assistants and Nurse Practitioners) who all work together to provide you with the care you need, when you need it.  Your next appointment:   2 year(s)  Provider:   Arnoldo Lapping, MD    We recommend signing up for the patient portal called "MyChart".  Sign up information is provided on this After Visit Summary.  MyChart is used to connect with patients for Virtual Visits (Telemedicine).  Patients are able to view lab/test results, encounter notes, upcoming appointments, etc.  Non-urgent messages can be sent to your provider as well.   To learn more about what you can do with MyChart, go to [email or web address].   Other Instructions

## 2023-08-22 DIAGNOSIS — L900 Lichen sclerosus et atrophicus: Secondary | ICD-10-CM | POA: Diagnosis not present

## 2023-08-22 DIAGNOSIS — L814 Other melanin hyperpigmentation: Secondary | ICD-10-CM | POA: Diagnosis not present

## 2023-08-22 DIAGNOSIS — L570 Actinic keratosis: Secondary | ICD-10-CM | POA: Diagnosis not present

## 2023-08-22 DIAGNOSIS — D235 Other benign neoplasm of skin of trunk: Secondary | ICD-10-CM | POA: Diagnosis not present

## 2023-08-22 DIAGNOSIS — L821 Other seborrheic keratosis: Secondary | ICD-10-CM | POA: Diagnosis not present

## 2023-08-26 ENCOUNTER — Other Ambulatory Visit (HOSPITAL_BASED_OUTPATIENT_CLINIC_OR_DEPARTMENT_OTHER): Payer: Self-pay

## 2023-08-26 MED ORDER — ZOSTER VAC RECOMB ADJUVANTED 50 MCG/0.5ML IM SUSR
0.5000 mL | Freq: Once | INTRAMUSCULAR | 1 refills | Status: AC
  Filled 2023-08-26: qty 0.5, 1d supply, fill #0

## 2023-08-30 DIAGNOSIS — R0683 Snoring: Secondary | ICD-10-CM | POA: Diagnosis not present

## 2023-09-02 DIAGNOSIS — Z1231 Encounter for screening mammogram for malignant neoplasm of breast: Secondary | ICD-10-CM | POA: Diagnosis not present

## 2023-09-07 ENCOUNTER — Other Ambulatory Visit (HOSPITAL_COMMUNITY): Payer: Self-pay

## 2023-09-08 ENCOUNTER — Other Ambulatory Visit (HOSPITAL_COMMUNITY): Payer: Self-pay

## 2023-09-08 MED ORDER — LEVOTHYROXINE SODIUM 88 MCG PO TABS
88.0000 ug | ORAL_TABLET | ORAL | 0 refills | Status: DC
  Filled 2023-09-08: qty 45, 90d supply, fill #0
  Filled 2023-12-06: qty 45, 90d supply, fill #1

## 2023-09-08 MED ORDER — LEVOTHYROXINE SODIUM 100 MCG PO TABS
100.0000 ug | ORAL_TABLET | ORAL | 0 refills | Status: DC
  Filled 2023-09-08: qty 45, 90d supply, fill #0
  Filled 2023-12-06: qty 45, 90d supply, fill #1

## 2023-09-23 DIAGNOSIS — G4733 Obstructive sleep apnea (adult) (pediatric): Secondary | ICD-10-CM | POA: Diagnosis not present

## 2023-09-27 DIAGNOSIS — G4733 Obstructive sleep apnea (adult) (pediatric): Secondary | ICD-10-CM | POA: Diagnosis not present

## 2023-11-14 ENCOUNTER — Other Ambulatory Visit (HOSPITAL_COMMUNITY): Payer: Self-pay

## 2023-11-16 ENCOUNTER — Other Ambulatory Visit (HOSPITAL_COMMUNITY): Payer: Self-pay

## 2023-11-16 MED ORDER — ATORVASTATIN CALCIUM 40 MG PO TABS
40.0000 mg | ORAL_TABLET | Freq: Every day | ORAL | 2 refills | Status: AC
  Filled 2023-11-16 – 2024-02-11 (×3): qty 90, 90d supply, fill #0

## 2023-11-18 ENCOUNTER — Other Ambulatory Visit (HOSPITAL_COMMUNITY): Payer: Self-pay

## 2023-12-15 DIAGNOSIS — G4734 Idiopathic sleep related nonobstructive alveolar hypoventilation: Secondary | ICD-10-CM | POA: Diagnosis not present

## 2023-12-15 DIAGNOSIS — G4733 Obstructive sleep apnea (adult) (pediatric): Secondary | ICD-10-CM | POA: Diagnosis not present

## 2023-12-27 ENCOUNTER — Other Ambulatory Visit (HOSPITAL_BASED_OUTPATIENT_CLINIC_OR_DEPARTMENT_OTHER): Payer: Self-pay

## 2023-12-27 MED ORDER — SHINGRIX 50 MCG/0.5ML IM SUSR
INTRAMUSCULAR | 0 refills | Status: AC
  Filled 2023-12-27: qty 0.5, 1d supply, fill #0

## 2024-01-10 ENCOUNTER — Other Ambulatory Visit (HOSPITAL_COMMUNITY): Payer: Self-pay

## 2024-01-10 ENCOUNTER — Encounter (HOSPITAL_COMMUNITY): Payer: Self-pay | Admitting: Pharmacist

## 2024-01-12 ENCOUNTER — Other Ambulatory Visit: Payer: Self-pay

## 2024-01-19 ENCOUNTER — Telehealth: Payer: Self-pay

## 2024-01-19 NOTE — Telephone Encounter (Signed)
   Name: Erin Rivas  DOB: 24-Feb-1961  MRN: [id number]  Primary Cardiologist: Ozell Fell, MD Last OV 08/19/23   Preoperative team, please contact this patient and set up a phone call appointment for further preoperative risk assessment. Please obtain consent and complete medication review. Thank you for your help.  I confirm that guidance regarding antiplatelet and oral anticoagulation therapy has been completed and, if necessary, noted below.  I also confirmed the patient resides in the state of [place] . As per [facility] Medical Board telemedicine laws, the patient must reside in the state in which the provider is licensed.   Appollonia Klee D Dalylah Ramey, NP 01/19/2024, 3:47 PM [facility]

## 2024-01-19 NOTE — Telephone Encounter (Signed)
   Pre-operative Risk Assessment    Patient Name: NAKEIA CALVI  DOB: 1960/10/27 MRN: [id number]   Date of last office visit: 08/19/23 Date of next office visit:    Request for Surgical Clearance    Procedure:  Colonoscopy   Date of Surgery:  Clearance 02/20/24                                 Surgeon:  Dr. Oliva Boots  Surgeon's Group or Practice Name:  [facility]  Phone number:  [phone] Fax number:  [phone]   Type of Clearance Requested:   - Medical    Type of Anesthesia:  Not Indicated   Additional requests/questions:    Bonney Rebeca Blight   01/19/2024, 1:48 PM

## 2024-01-19 NOTE — Telephone Encounter (Signed)
Patient has been scheduled for televisit.

## 2024-01-19 NOTE — Telephone Encounter (Signed)
 Patient has been scheduled for televisit med rec and consent done     Patient Consent for Virtual Visit         Erin Rivas has provided verbal consent on 01/19/2024 for a virtual visit (video or telephone).   CONSENT FOR VIRTUAL VISIT FOR:  Erin Rivas  By participating in this virtual visit I agree to the following:  I hereby voluntarily request, consent and authorize [facility] and its employed or contracted physicians, physician assistants, nurse practitioners or other licensed health care professionals (the Practitioner), to provide me with telemedicine health care services (the "Services) as deemed necessary by the treating Practitioner. I acknowledge and consent to receive the Services by the Practitioner via telemedicine. I understand that the telemedicine visit will involve communicating with the Practitioner through live audiovisual communication technology and the disclosure of certain medical information by electronic transmission. I acknowledge that I have been given the opportunity to request an in-person assessment or other available alternative prior to the telemedicine visit and am voluntarily participating in the telemedicine visit.  I understand that I have the right to withhold or withdraw my consent to the use of telemedicine in the course of my care at any time, without affecting my right to future care or treatment, and that the Practitioner or I may terminate the telemedicine visit at any time. I understand that I have the right to inspect all information obtained and/or recorded in the course of the telemedicine visit and may receive copies of available information for a reasonable fee.  I understand that some of the potential risks of receiving the Services via telemedicine include:  Delay or interruption in medical evaluation due to technological equipment failure or disruption; Information transmitted may not be sufficient (e.g. poor resolution of  images) to allow for appropriate medical decision making by the Practitioner; and/or  In rare instances, security protocols could fail, causing a breach of personal health information.  Furthermore, I acknowledge that it is my responsibility to provide information about my medical history, conditions and care that is complete and accurate to the best of my ability. I acknowledge that Practitioner's advice, recommendations, and/or decision may be based on factors not within their control, such as incomplete or inaccurate data provided by me or distortions of diagnostic images or specimens that may result from electronic transmissions. I understand that the practice of medicine is not an exact science and that Practitioner makes no warranties or guarantees regarding treatment outcomes. I acknowledge that a copy of this consent can be made available to me via my patient portal [facility] MyChart), or I can request a printed copy by calling the office of [facility].    I understand that my insurance will be billed for this visit.   I have read or had this consent read to me. I understand the contents of this consent, which adequately explains the benefits and risks of the Services being provided via telemedicine.  I have been provided ample opportunity to ask questions regarding this consent and the Services and have had my questions answered to my satisfaction. I give my informed consent for the services to be provided through the use of telemedicine in my medical care

## 2024-01-27 ENCOUNTER — Other Ambulatory Visit: Payer: Self-pay | Admitting: Family Medicine

## 2024-01-27 ENCOUNTER — Ambulatory Visit
Admission: RE | Admit: 2024-01-27 | Discharge: 2024-01-27 | Disposition: A | Discharge status: Home / Self Care | Source: Ambulatory Visit | Attending: Family Medicine | Admitting: Family Medicine

## 2024-01-27 DIAGNOSIS — R0989 Other specified symptoms and signs involving the circulatory and respiratory systems: Secondary | ICD-10-CM

## 2024-01-27 DIAGNOSIS — I429 Cardiomyopathy, unspecified: Secondary | ICD-10-CM | POA: Diagnosis not present

## 2024-01-27 DIAGNOSIS — G4733 Obstructive sleep apnea (adult) (pediatric): Secondary | ICD-10-CM | POA: Diagnosis not present

## 2024-01-27 DIAGNOSIS — R0602 Shortness of breath: Secondary | ICD-10-CM | POA: Diagnosis not present

## 2024-01-27 DIAGNOSIS — E039 Hypothyroidism, unspecified: Secondary | ICD-10-CM | POA: Diagnosis not present

## 2024-01-27 DIAGNOSIS — R5382 Chronic fatigue, unspecified: Secondary | ICD-10-CM | POA: Diagnosis not present

## 2024-01-27 DIAGNOSIS — J849 Interstitial pulmonary disease, unspecified: Secondary | ICD-10-CM | POA: Diagnosis not present

## 2024-01-30 ENCOUNTER — Other Ambulatory Visit (HOSPITAL_COMMUNITY): Payer: Self-pay

## 2024-01-30 DIAGNOSIS — E039 Hypothyroidism, unspecified: Secondary | ICD-10-CM | POA: Diagnosis not present

## 2024-01-30 DIAGNOSIS — R0602 Shortness of breath: Secondary | ICD-10-CM | POA: Diagnosis not present

## 2024-01-30 MED ORDER — AZITHROMYCIN 250 MG PO TABS
ORAL_TABLET | ORAL | 0 refills | Status: AC
  Filled 2024-01-30: qty 6, 5d supply, fill #0

## 2024-02-08 ENCOUNTER — Ambulatory Visit: Attending: Cardiology | Admitting: Physician Assistant

## 2024-02-08 DIAGNOSIS — Z01810 Encounter for preprocedural cardiovascular examination: Secondary | ICD-10-CM

## 2024-02-08 NOTE — Progress Notes (Signed)
 Virtual Visit via Telephone Note   Because of Erin Rivas co-morbid illnesses, she is at least at moderate risk for complications without adequate follow up.  This format is felt to be most appropriate for this patient at this time.  Due to technical limitations with video connection (technology), today's appointment will be conducted as an audio only telehealth visit, and [name] verbally agreed to proceed in this manner.   All issues noted in this document were discussed and addressed.  No physical exam could be performed with this format.  Evaluation Performed:  Preoperative cardiovascular risk assessment _____________   Date:  02/08/2024   Patient ID:  Erin Rivas, DOB [birth year], MRN [id number] Patient Location:  Home Provider location:   Office  Primary Care Provider:  Sun, Vyvyan, MD Primary Cardiologist:  Ozell Fell, MD  Chief Complaint / Patient Profile   63 y.o. y/o female with a h/o ASD with transcatheter closure in 2019, hypertension, hyperlipemia  who is pending colonoscopy and presents today for telephonic preoperative cardiovascular risk assessment.  History of Present Illness    Erin Rivas is a 63 y.o. female who presents via audio/video conferencing for a telehealth visit today.  Pt was last seen in cardiology clinic on 08/19/23 by Glendia Ferrier, PA-C.  At that time Erin Rivas was doing well without any cardiac issues.  The patient is now pending procedure as outlined above. Since her last visit, she states that the only thing that is different is she has had some lung issues with some shortness of breath and is on a Z-Pak.  Recently finished up her antibiotics and is starting to feel a little bit better.  She has not been doing any organized exercise but she has no trouble walking or doing daily tasks.  For this reason she does meet minimal METS clearance.  No chest pain or shortness of breath.  No issues with blood pressure or  cholesterol.  No medications need to held.    Past Medical History    Past Medical History:  Diagnosis Date   ASD (atrial septal defect)    a. 03/2018 s/p closure w/ 18mm Amplatzer cribriform device and 25mm Amplatzer PFO occluder.   Heart murmur    Hyperlipidemia    Hypothyroidism    Migraine    hx; would not occur without exertion; none in years (03/31/2018)   Obesity    Sinus bradycardia    Trace mitral regurgitation by prior echocardiogram 01/31/2006   Trace tricuspid regurgitation by prior echocardiogram 01/31/2006   Past Surgical History:  Procedure Laterality Date   ASD REPAIR  03/31/2018   ATRIAL SEPTAL DEFECT(ASD) CLOSURE N/A 03/31/2018   Procedure: ATRIAL SEPTAL DEFECT (ASD) CLOSURE;  Surgeon: Fell Ozell, MD;  Location: [facility] INVASIVE CV LAB;  Service: Cardiovascular;  Laterality: N/A;   DOPPLER ECHOCARDIOGRAPHY  01/31/2006   ELECTROCARDIOGRAM  05/2008   RIGHT HEART CATH N/A 12/16/2017   Procedure: RIGHT HEART CATH;  Surgeon: Claudene Victory ORN, MD;  Location: [facility] INVASIVE CV LAB;  Service: Cardiovascular;  Laterality: N/A;   TEE WITHOUT CARDIOVERSION N/A 11/28/2013   Procedure: TRANSESOPHAGEAL ECHOCARDIOGRAM (TEE);  Surgeon: Leim VEAR Moose, MD;  Location: [facility] ENDOSCOPY;  Service: Cardiovascular;  Laterality: N/A;   TEE WITHOUT CARDIOVERSION N/A 12/16/2017   Procedure: TRANSESOPHAGEAL ECHOCARDIOGRAM (TEE);  Surgeon: Rolan Ezra RAMAN, MD;  Location: [facility] ENDOSCOPY;  Service: Cardiovascular;  Laterality: N/A;    Allergies  No Known Allergies  Home Medications    Prior  to Admission medications   Medication Sig Start Date End Date Taking? Authorizing Provider  atorvastatin  (LIPITOR) 40 MG tablet Take 1 tablet (40 mg total) by mouth daily. 11/16/23     azithromycin (ZITHROMAX Z-PAK) 250 MG tablet Take 2 tablets on day 1 then take 1 tablet once daily for 4 days 01/30/24     clobetasol  ointment (TEMOVATE ) 0.05 % Apply a thin layer to affected areas topically 2-3 times  weekly 07/21/22     cyclobenzaprine  (FLEXERIL ) 10 MG tablet Take 1 tablet (10 mg total) by mouth 3 times/day as needed-between meals & bedtime for muscle spasms. 02/05/22   Rosalynn Camie CROME, MD  estradiol  (ESTRACE ) 0.1 MG/GM vaginal cream Place 2 grams vaginally once a week as needed 06/11/22     influenza vac split quadrivalent PF (FLUARIX) 0.5 ML injection Inject 0.5 mLs into the muscle once.    [provider]  levothyroxine  (SYNTHROID ) 100 MCG tablet Take 1 tablet (100 mcg total) by mouth every other day in the morning on an empty stomach alternating with 88 mcg tablet. 12/11/21     levothyroxine  (SYNTHROID ) 100 MCG tablet Take 1 tablet by mouth every other morning on an empty stomach, alternating every other day with 88 mcg dose 09/08/23     levothyroxine  (SYNTHROID ) 88 MCG tablet Take 1 tablet by mouth every other morning on an empty stomach, alternating every other day with 100mcg dose 09/08/23     valACYclovir  (VALTREX ) 1000 MG tablet Take 2 tablets (2,000 mg total) by mouth every 12 (twelve) hours as needed for outbreaks 03/22/23     Zoster Vaccine Adjuvanted (SHINGRIX ) injection Inject into the muscle. 12/27/23   Luiz Channel, MD    Physical Exam    Vital Signs:  LACHLYN VANDERSTELT does not have vital signs available for review today.  Given telephonic nature of communication, physical exam is limited. AAOx3. NAD. Normal affect.  Speech and respirations are unlabored.  Accessory Clinical Findings    None  Assessment & Plan    1.  Preoperative Cardiovascular Risk Assessment:   Ms. Ostermiller's perioperative risk of a major cardiac event is 0.4% according to the Revised Cardiac Risk Index (RCRI).  Therefore, she is at low risk for perioperative complications.   Her functional capacity is good at 5.62 METs according to the Duke Activity Status Index (DASI). Recommendations: According to ACC/AHA guidelines, no further cardiovascular testing needed.  The patient may proceed to surgery at  acceptable risk.     The patient was advised that if she develops new symptoms prior to surgery to contact our office to arrange for a follow-up visit, and she verbalized understanding.   A copy of this note will be routed to requesting surgeon.  Time:   Today, I have spent 6 minutes with the patient with telehealth technology discussing medical history, symptoms, and management plan.     Orren LOISE Fabry, PA-C  02/08/2024, 3:00 PM

## 2024-02-10 ENCOUNTER — Other Ambulatory Visit (HOSPITAL_BASED_OUTPATIENT_CLINIC_OR_DEPARTMENT_OTHER): Payer: Self-pay

## 2024-02-11 ENCOUNTER — Other Ambulatory Visit (HOSPITAL_COMMUNITY): Payer: Self-pay

## 2024-02-13 ENCOUNTER — Other Ambulatory Visit: Payer: Self-pay

## 2024-02-13 MED ORDER — PEG 3350-KCL-NA BICARB-NACL 420 G PO SOLR
ORAL | 0 refills | Status: AC
  Filled 2024-02-13: qty 4000, 1d supply, fill #0

## 2024-02-13 MED ORDER — BISACODYL 5 MG PO TBEC
DELAYED_RELEASE_TABLET | ORAL | 0 refills | Status: AC
  Filled 2024-02-13: qty 4, 1d supply, fill #0

## 2024-02-20 DIAGNOSIS — D125 Benign neoplasm of sigmoid colon: Secondary | ICD-10-CM | POA: Diagnosis not present

## 2024-02-20 DIAGNOSIS — Z1211 Encounter for screening for malignant neoplasm of colon: Secondary | ICD-10-CM | POA: Diagnosis not present

## 2024-02-20 DIAGNOSIS — D123 Benign neoplasm of transverse colon: Secondary | ICD-10-CM | POA: Diagnosis not present

## 2024-02-27 ENCOUNTER — Ambulatory Visit: Admitting: Family Medicine

## 2024-02-27 ENCOUNTER — Other Ambulatory Visit: Payer: Self-pay

## 2024-02-27 VITALS — BP 126/84

## 2024-02-27 DIAGNOSIS — M25512 Pain in left shoulder: Secondary | ICD-10-CM

## 2024-02-27 DIAGNOSIS — G8929 Other chronic pain: Secondary | ICD-10-CM | POA: Diagnosis not present

## 2024-02-27 NOTE — Patient Instructions (Signed)
 You have rotator cuff tendinopathy, bursitis, biceps tenosynovitis. Try to avoid painful activities (overhead activities, lifting with extended arm) as much as possible. Aleve 2 tabs twice a day with food for pain and inflammation as needed. Can take tylenol  in addition to this. Subacromial injection may be beneficial to help with pain and to decrease inflammation - you were given this today Consider physical therapy with transition to home exercise program. Wait about 5-7 days before starting the home exercises - Do home exercise program with theraband and scapular stabilization exercises daily 3 sets of 10 once a day. Follow up with me in 1 month but text/call to let me know how you're doing sooner if you're struggling.

## 2024-02-28 DIAGNOSIS — G8929 Other chronic pain: Secondary | ICD-10-CM | POA: Diagnosis not present

## 2024-02-28 DIAGNOSIS — M25512 Pain in left shoulder: Secondary | ICD-10-CM | POA: Diagnosis not present

## 2024-02-28 MED ORDER — METHYLPREDNISOLONE ACETATE 40 MG/ML IJ SUSP
40.0000 mg | Freq: Once | INTRAMUSCULAR | Status: AC
  Administered 2024-02-28: 40 mg via INTRA_ARTICULAR

## 2024-02-28 NOTE — Progress Notes (Signed)
 PCP: Sun, Vyvyan, MD  Discussed the use of AI scribe software for clinical note transcription with the patient, who gave verbal consent to proceed.  History of Present Illness Erin Rivas is a 63 year old female who presents with left arm pain.  Left arm pain - Pain localized to the left arm, extending from the shoulder to the biceps and occasionally into the elbow - Onset a few days after receiving a shingles vaccine in the left arm eight weeks ago - Progressive worsening of pain over time - Pain is severe enough to disrupt sleep, especially when lying on the affected side - Aleve provides partial relief, but pain persists  Associated symptoms - No numbness or tingling in the left arm - No neck pain  Past Medical History:  Diagnosis Date   ASD (atrial septal defect)    a. 03/2018 s/p closure w/ 18mm Amplatzer cribriform device and 25mm Amplatzer PFO occluder.   Heart murmur    Hyperlipidemia    Hypothyroidism    Migraine    hx; would not occur without exertion; none in years (03/31/2018)   Obesity    Sinus bradycardia    Trace mitral regurgitation by prior echocardiogram 01/31/2006   Trace tricuspid regurgitation by prior echocardiogram 01/31/2006    Current Outpatient Medications on File Prior to Visit  Medication Sig Dispense Refill   atorvastatin  (LIPITOR) 40 MG tablet Take 1 tablet (40 mg total) by mouth daily. 90 tablet 2   azithromycin  (ZITHROMAX  Z-PAK) 250 MG tablet Take 2 tablets on day 1 then take 1 tablet once daily for 4 days 6 tablet 0   bisacodyl  (DULCOLAX) 5 MG EC tablet Use as directed 4 tablet 0   clobetasol  ointment (TEMOVATE ) 0.05 % Apply a thin layer to affected areas topically 2-3 times weekly 30 g 3   cyclobenzaprine  (FLEXERIL ) 10 MG tablet Take 1 tablet (10 mg total) by mouth 3 times/day as needed-between meals & bedtime for muscle spasms. 30 tablet 0   estradiol  (ESTRACE ) 0.1 MG/GM vaginal cream Place 2 grams vaginally once a week as needed 42.5  g 0   influenza vac split quadrivalent PF (FLUARIX) 0.5 ML injection Inject 0.5 mLs into the muscle once.     levothyroxine  (SYNTHROID ) 100 MCG tablet Take 1 tablet (100 mcg total) by mouth every other day in the morning on an empty stomach alternating with 88 mcg tablet. 90 tablet 1   levothyroxine  (SYNTHROID ) 100 MCG tablet Take 1 tablet by mouth every other morning on an empty stomach, alternating every other day with 88 mcg dose 90 tablet 0   levothyroxine  (SYNTHROID ) 88 MCG tablet Take 1 tablet by mouth every other morning on an empty stomach, alternating every other day with 100mcg dose 90 tablet 0   polyethylene glycol-electrolytes (NULYTELY) 420 g solution Use as directed 4000 mL 0   valACYclovir  (VALTREX ) 1000 MG tablet Take 2 tablets (2,000 mg total) by mouth every 12 (twelve) hours as needed for outbreaks 20 tablet 0   Zoster Vaccine Adjuvanted (SHINGRIX ) injection Inject into the muscle. 0.5 mL 0   Current Facility-Administered Medications on File Prior to Visit  Medication Dose Route Frequency Provider Last Rate Last Admin   sodium chloride  flush (NS) 0.9 % injection 10 mL  10 mL Intravenous PRN Thompson, Kathryn R, PA-C   10 mL at 04/04/19 1000    Past Surgical History:  Procedure Laterality Date   ASD REPAIR  03/31/2018   ATRIAL SEPTAL DEFECT(ASD) CLOSURE N/A 03/31/2018  Procedure: ATRIAL SEPTAL DEFECT (ASD) CLOSURE;  Surgeon: Wonda Sharper, MD;  Location: [facility] INVASIVE CV LAB;  Service: Cardiovascular;  Laterality: N/A;   DOPPLER ECHOCARDIOGRAPHY  01/31/2006   ELECTROCARDIOGRAM  05/2008   RIGHT HEART CATH N/A 12/16/2017   Procedure: RIGHT HEART CATH;  Surgeon: Claudene Victory ORN, MD;  Location: [facility] INVASIVE CV LAB;  Service: Cardiovascular;  Laterality: N/A;   TEE WITHOUT CARDIOVERSION N/A 11/28/2013   Procedure: TRANSESOPHAGEAL ECHOCARDIOGRAM (TEE);  Surgeon: Leim VEAR Moose, MD;  Location: [facility] ENDOSCOPY;  Service: Cardiovascular;  Laterality: N/A;   TEE WITHOUT CARDIOVERSION N/A  12/16/2017   Procedure: TRANSESOPHAGEAL ECHOCARDIOGRAM (TEE);  Surgeon: Rolan Ezra RAMAN, MD;  Location: [facility] ENDOSCOPY;  Service: Cardiovascular;  Laterality: N/A;    No Known Allergies  BP 126/84       No data to display              No data to display              Objective:  Physical Exam:  Gen: NAD, comfortable in exam room  Left shoulder: No swelling, ecchymoses.  No gross deformity. TTP biceps tendon, less in biceps muscle and triceps. FROM. Negative Hawkins, Mildly positive Neers. Negative Yergasons. Positive speeds Strength 5-/5 with empty can and 5/5 resisted internal/external rotation.  Pain empty can > external rotation Negative apprehension. Negative o'briens NV intact distally.  Complete MSK u/s left shoulder: Biceps tendon: intact with tenosynovitis Pec major tendon: intact without abnormalities Subscapularis: very small partial tear at insertion AC joint: mod arthropathy, no pain with sonopalpation Infraspinatus: intact without abnormalities Supraspinatus: mixed hypoechoic and hyperechoic change in portions consistent with tendinopathy, no visible tear.  Subacromial bursitis Posterior glenohumeral joint: no abnormalities  Impression: Supraspinatus tendinopathy, subacromial bursitis, biceps tenosynovitis. Assessment & Plan Left shoulder pain - due to impingement, supraspinatus tendinopathy, subacromial bursitis, biceps tenosynovitis.   - home exercise program reviewed today - subacromial injection given to help with pain relief - aleve as needed  After informed written consent timeout was performed, patient was seated in chair in exam room. Left shoulder was prepped with alcohol swab and utilizing lateral approach with ultrasound guidance, patient's left subacromial space was injected with 3:1 lidocaine : depomedrol. Patient tolerated the procedure well without immediate complications.

## 2024-03-04 ENCOUNTER — Other Ambulatory Visit (HOSPITAL_COMMUNITY): Payer: Self-pay

## 2024-03-07 ENCOUNTER — Other Ambulatory Visit (HOSPITAL_COMMUNITY): Payer: Self-pay

## 2024-03-07 MED ORDER — LEVOTHYROXINE SODIUM 88 MCG PO TABS
ORAL_TABLET | ORAL | 0 refills | Status: DC
  Filled 2024-03-07: qty 90, 90d supply, fill #0

## 2024-03-07 MED ORDER — LEVOTHYROXINE SODIUM 100 MCG PO TABS
ORAL_TABLET | ORAL | 0 refills | Status: DC
  Filled 2024-03-07: qty 90, 90d supply, fill #0

## 2024-03-13 ENCOUNTER — Encounter (HOSPITAL_COMMUNITY): Payer: Self-pay

## 2024-03-13 ENCOUNTER — Other Ambulatory Visit (HOSPITAL_COMMUNITY): Payer: Self-pay

## 2024-03-13 ENCOUNTER — Other Ambulatory Visit: Payer: Self-pay

## 2024-03-14 ENCOUNTER — Other Ambulatory Visit (HOSPITAL_COMMUNITY): Payer: Self-pay

## 2024-04-17 ENCOUNTER — Ambulatory Visit
Admission: RE | Admit: 2024-04-17 | Discharge: 2024-04-17 | Disposition: A | Source: Ambulatory Visit | Attending: Family Medicine | Admitting: Family Medicine

## 2024-04-17 ENCOUNTER — Encounter: Payer: Self-pay | Admitting: Family Medicine

## 2024-04-17 ENCOUNTER — Ambulatory Visit: Payer: Self-pay

## 2024-04-17 ENCOUNTER — Ambulatory Visit: Payer: Self-pay | Admitting: Family Medicine

## 2024-04-17 ENCOUNTER — Ambulatory Visit (INDEPENDENT_AMBULATORY_CARE_PROVIDER_SITE_OTHER): Admitting: Family Medicine

## 2024-04-17 DIAGNOSIS — M25572 Pain in left ankle and joints of left foot: Secondary | ICD-10-CM | POA: Diagnosis not present

## 2024-04-17 DIAGNOSIS — M79672 Pain in left foot: Secondary | ICD-10-CM

## 2024-04-17 NOTE — Progress Notes (Signed)
 04/17/24 @ 17:30 EST Spoke to patient on the phone to review left foot and ankle x-rays.  Images personally reviewed.   Left ankle x-ray 3 views AP, lateral, mortise showing: IMPRESSION: 1. Suspected acute avulsion fracture of the lateral hindfoot; recommend immobilization and orthopedic follow-up, with CT or MRI if pain persists  or if further characterization is needed. 2. Dorsal navicular cortical irregularity compatible with an  age-indeterminate avulsion injury; consider CT or MRI to assess acuity if there is focal tenderness or persistent pain. 3. 6 mm linear metallic foreign body in the plantar hindfoot soft tissues; recommend focused examination for puncture wound and consideration of  removal, with ultrasound if localization is needed.   Left foot x-ray 3 views AP, lateral, oblique showing: IMPRESSION: No fracture or dislocation is noted. Small linear density is seen in plantar soft tissues inferior to calcaneus concerning for foreign body.   Patient notes that the foot and ankle have been feeling comfortable in the cam boot throughout the day.  Advised that it does look like she has a small avulsion off the lateral aspect of her calcaneus.  Also possible small navicular avulsion, although she did not have any specific tenderness in that area on exam today.  She should continue with the cam boot as she is doing.  Will likely need to use this for 4 to 6 weeks.   X-rays also showed concern for possible metallic foreign body in the plantar soft tissues.  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  She did note that radiology tech did not require her to remove her sock for the exam.  Metallic density measures approximately 6 mm in the area of the plantar hindfoot soft tissues.  Patient will be in our office tomorrow morning for a meeting, we will plan to perform ultrasound of the area to further evaluate.  If indeterminant, could consider  updated x-ray without sock to rule out potential of external foreign body.   Patient expressed understanding and agreement with above.  All questions were answered.

## 2024-04-17 NOTE — Progress Notes (Addendum)
 DATE OF VISIT: 04/17/2024        Erin Rivas DOB: [birth year] MRN: [id number]  Discussed the use of AI scribe software for clinical note transcription with the patient, who gave verbal consent to proceed.  History of Present Illness Erin Rivas is a 64 year old female who presents with acute left foot and ankle pain following an injury sustained yesterday.  Left Foot and Ankle Pain and Swelling: - Sustained an inversion injury to the left ankle and foot after stepping down unexpectedly, resulting in a fall onto the left lower leg - Immediate onset of pain following injury - Pain localized to the left anterior lateral ankle by morning - Swelling present along the left lower leg and left ankle - Abrasions noted along the left lower leg - No bruising, increased redness, numbness, or tingling - Able to ambulate with pain  Symptom Management and Functional Status: - Managed symptoms overnight with Aleve and rest - Required assistance to stand immediately after injury - Continued to ambulate with pain - No prior left lower extremity injuries or symptoms    Medications:  Outpatient Encounter Medications as of 04/17/2024  Medication Sig   atorvastatin  (LIPITOR) 40 MG tablet Take 1 tablet (40 mg total) by mouth daily.   azithromycin  (ZITHROMAX  Z-PAK) 250 MG tablet Take 2 tablets on day 1 then take 1 tablet once daily for 4 days   bisacodyl  (DULCOLAX) 5 MG EC tablet Use as directed   clobetasol  ointment (TEMOVATE ) 0.05 % Apply a thin layer to affected areas topically 2-3 times weekly   cyclobenzaprine  (FLEXERIL ) 10 MG tablet Take 1 tablet (10 mg total) by mouth 3 times/day as needed-between meals & bedtime for muscle spasms.   estradiol  (ESTRACE ) 0.1 MG/GM vaginal cream Place 2 grams vaginally once a week as needed   influenza vac split quadrivalent PF (FLUARIX) 0.5 ML injection Inject 0.5 mLs into the muscle once.   levothyroxine  (SYNTHROID ) 100 MCG tablet Take 1 tablet (100 mcg  total) by mouth every other day in the morning on an empty stomach alternating with 88 mcg tablet.   levothyroxine  (SYNTHROID ) 100 MCG tablet Take 1 tablet every other morning on an empty stomach, alternating every other day with 88 mcg dose   levothyroxine  (SYNTHROID ) 88 MCG tablet Take 1 tablet every other morning on an empty stomach, alternating every other day with 100mcg dose   polyethylene glycol-electrolytes (NULYTELY) 420 g solution Use as directed   valACYclovir  (VALTREX ) 1000 MG tablet Take 2 tablets (2,000 mg total) by mouth every 12 (twelve) hours as needed for outbreaks   Zoster Vaccine Adjuvanted (SHINGRIX ) injection Inject into the muscle.   Facility-Administered Encounter Medications as of 04/17/2024  Medication   sodium chloride  flush (NS) 0.9 % injection 10 mL    Allergies: has no known allergies.  Physical Examination: Physical Exam GENERAL: Alert, oriented, no acute distress. CARDIOVASCULAR: 2+ symmetric pulses dorsalis pedis and posterior tibialis bilaterally. MUSCULOSKELETAL: Soft tissue swelling left distal fibula and left ankle. No bony tenderness left fibula. Decreased ROM left ankle with pain. Tender ATFL and base of 5th metatarsal. Mild tenderness over cuboid. No palpable defects or step offs. Ankle strength 5-/5, pain with eversion/inversion. Positive anterior drawer and talar tilt, negative squeeze test. Right foot/ankle pain, weakness, instability. Antalgic gait. NEUROLOGICAL: Neurosensation and tactile light touch intact. SKIN: No rashes or lesions.   Radiology: MSK ultrasound left ankle Date: 04/17/2024 Indication: Left foot and ankle pain status post fall yesterday Findings: -Appearing ankle joint, no  effusion.  Normal-appearing talar dome - Complete disruption of the ATFL with surrounding edema. - Normal-appearing fibula and distal fibula, no cortical irregularities noted. - Normal-appearing anterior tib-fib ligament at the syndesmosis. -  Normal-appearing peroneal tendons, small amount of increased fluid along the distal tendon sheath, no signs of tearing.  Normal insertion of peroneus brevis on the base of the fifth metatarsal. - Normal-appearing base of the fifth metatarsal and fifth metatarsal. - Normal-appearing cuboid and 1st and 2nd metatarsals  Impression: - Ankle sprain with complete tear of the ATFL - No acute bony abnormalities appreciated Images and interpretation completed by Rainell Cedar, DO   Assessment & Plan Acute left ankle sprain with contusions and pain of the left foot and lower leg, cannot rule out occult fracture Acute left lateral ankle sprain with complete ATFL tear confirmed by ultrasound. Possible occult fracture at the base of the fifth metatarsal and midfoot due to bony tenderness. - Ordered left foot and ankle radiographs to evaluate for occult fracture. - Placed in a CAM boot for 1-2 weeks. - Advised continuation of naproxen 2200 mg, 2 tabs twice daily with food for 10-14 days. - Recommended ice as needed. - Advised follow-up in 1-2 weeks or sooner if symptoms worsen or questions arise. - Discussed possible future transition to an ASO lace-up ankle brace.  Patient expressed understanding & agreement with above.  ----------------------------------------------------------------- AFTER VISIT ADDENDUM 04/17/24 @ 17:30 EST Spoke to patient on the phone to review left foot and ankle x-rays.  Images personally reviewed.  Left ankle x-ray 3 views AP, lateral, mortise showing: IMPRESSION: 1. Suspected acute avulsion fracture of the lateral hindfoot; recommend immobilization and orthopedic follow-up, with CT or MRI if pain persists  or if further characterization is needed. 2. Dorsal navicular cortical irregularity compatible with an  age-indeterminate avulsion injury; consider CT or MRI to assess acuity if there is focal tenderness or persistent pain. 3. 6 mm linear metallic foreign body in the  plantar hindfoot soft tissues; recommend focused examination for puncture wound and consideration of  removal, with ultrasound if localization is needed.  Left foot x-ray 3 views AP, lateral, oblique showing: IMPRESSION: No fracture or dislocation is noted. Small linear density is seen in plantar soft tissues inferior to calcaneus concerning for foreign body.  Patient notes that the foot and ankle have been feeling comfortable in the cam boot throughout the day.  Advised that it does look like she has a small avulsion off the lateral aspect of her calcaneus.  Also possible small navicular avulsion, although she did not have any specific tenderness in that area on exam today.  She should continue with the cam boot as she is doing.  Will likely need to use this for 4 to 6 weeks.  X-rays also showed concern for possible metallic foreign body in the plantar soft tissues.  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  She did note that radiology tech did not require her to remove her sock for the exam.  Metallic density measures approximately 6 mm in the area of the plantar hindfoot soft tissues.  Patient will be in our office tomorrow morning for a meeting, we will plan to perform ultrasound of the area to further evaluate.  If indeterminant, could consider updated x-ray without sock to rule out potential of external foreign body.  Patient expressed understanding and agreement with above.  All questions were answered.  Encounter Diagnoses  Name Primary?   Acute left ankle  pain Yes   Left foot pain     Orders Placed This Encounter  Procedures   US  LIMITED JOINT SPACE STRUCTURES LOW LEFT   DG Foot Complete Left   DG Ankle Complete Left     Contains text generated by [employer].

## 2024-04-18 ENCOUNTER — Other Ambulatory Visit: Payer: Self-pay

## 2024-04-18 DIAGNOSIS — M79672 Pain in left foot: Secondary | ICD-10-CM

## 2024-04-18 NOTE — Addendum Note (Signed)
 Addended by: TERESSA RAINELL BROCKS on: 04/18/2024 09:07 AM   Modules accepted: Orders

## 2024-04-20 ENCOUNTER — Other Ambulatory Visit (HOSPITAL_COMMUNITY): Payer: Self-pay

## 2024-04-20 MED ORDER — LEVOTHYROXINE SODIUM 88 MCG PO TABS: 88.0000 ug | ORAL_TABLET | ORAL | 0 refills | Status: AC

## 2024-04-20 MED ORDER — LEVOTHYROXINE SODIUM 100 MCG PO TABS: 100.0000 ug | ORAL_TABLET | ORAL | 0 refills | Status: AC
